# Patient Record
Sex: Female | Born: 1960 | Race: White | Hispanic: No | Smoking: Current every day smoker
Health system: Southern US, Community
[De-identification: ages and names within clinical notes are randomized; demographics above are authoritative.]

## PROBLEM LIST (undated history)

## (undated) HISTORY — PX: BLADDER SUSPENSION: SHX72

## (undated) HISTORY — PX: CHOLECYSTECTOMY: SHX55

## (undated) HISTORY — PX: CARPAL TUNNEL RELEASE: SHX101

## (undated) HISTORY — PX: PORT-A-CATH REMOVAL: SHX5289

## (undated) HISTORY — PX: NISSEN FUNDOPLICATION: SHX2091

## (undated) HISTORY — PX: HIP ARTHROSCOPY W/ LABRAL DEBRIDEMENT: SHX1749

## (undated) HISTORY — PX: ABDOMINAL HYSTERECTOMY: SHX81

## (undated) HISTORY — PX: PORTACATH PLACEMENT: SHX2246

## (undated) HISTORY — PX: BACK SURGERY: SHX140

---

## 1999-12-09 ENCOUNTER — Emergency Department (HOSPITAL_COMMUNITY): Admission: EM | Admit: 1999-12-09 | Discharge: 1999-12-09 | Payer: Self-pay | Admitting: Emergency Medicine

## 1999-12-11 ENCOUNTER — Ambulatory Visit (HOSPITAL_COMMUNITY): Admission: RE | Admit: 1999-12-11 | Discharge: 1999-12-11 | Payer: Self-pay | Admitting: Neurosurgery

## 1999-12-30 ENCOUNTER — Ambulatory Visit (HOSPITAL_COMMUNITY): Admission: RE | Admit: 1999-12-30 | Discharge: 1999-12-30 | Payer: Self-pay | Admitting: Neurosurgery

## 2000-01-13 ENCOUNTER — Ambulatory Visit (HOSPITAL_COMMUNITY): Admission: RE | Admit: 2000-01-13 | Discharge: 2000-01-13 | Payer: Self-pay | Admitting: Neurosurgery

## 2000-01-27 ENCOUNTER — Ambulatory Visit (HOSPITAL_COMMUNITY): Admission: RE | Admit: 2000-01-27 | Discharge: 2000-01-27 | Payer: Self-pay | Admitting: Neurosurgery

## 2000-04-07 ENCOUNTER — Encounter: Admission: RE | Admit: 2000-04-07 | Discharge: 2000-04-07 | Payer: Self-pay | Admitting: Neurosurgery

## 2000-06-10 ENCOUNTER — Ambulatory Visit (HOSPITAL_COMMUNITY): Admission: RE | Admit: 2000-06-10 | Discharge: 2000-06-10 | Payer: Self-pay | Admitting: Neurosurgery

## 2000-07-27 ENCOUNTER — Inpatient Hospital Stay (HOSPITAL_COMMUNITY): Admission: RE | Admit: 2000-07-27 | Discharge: 2000-07-31 | Payer: Self-pay | Admitting: Neurosurgery

## 2000-09-01 ENCOUNTER — Encounter: Admission: RE | Admit: 2000-09-01 | Discharge: 2000-09-01 | Payer: Self-pay | Admitting: Neurosurgery

## 2000-10-30 ENCOUNTER — Encounter: Admission: RE | Admit: 2000-10-30 | Discharge: 2000-10-30 | Payer: Self-pay | Admitting: Neurosurgery

## 2001-01-29 ENCOUNTER — Encounter: Admission: RE | Admit: 2001-01-29 | Discharge: 2001-01-29 | Payer: Self-pay | Admitting: Neurosurgery

## 2001-03-02 ENCOUNTER — Encounter: Payer: Self-pay | Admitting: Orthopedic Surgery

## 2001-03-02 ENCOUNTER — Ambulatory Visit (HOSPITAL_COMMUNITY): Admission: RE | Admit: 2001-03-02 | Discharge: 2001-03-02 | Payer: Self-pay | Admitting: Orthopedic Surgery

## 2001-03-22 ENCOUNTER — Encounter: Payer: Self-pay | Admitting: Orthopedic Surgery

## 2001-03-24 ENCOUNTER — Ambulatory Visit (HOSPITAL_COMMUNITY): Admission: RE | Admit: 2001-03-24 | Discharge: 2001-03-24 | Payer: Self-pay | Admitting: Orthopedic Surgery

## 2001-03-24 ENCOUNTER — Encounter: Payer: Self-pay | Admitting: Orthopedic Surgery

## 2001-06-18 ENCOUNTER — Encounter: Payer: Self-pay | Admitting: Orthopedic Surgery

## 2001-06-18 ENCOUNTER — Ambulatory Visit (HOSPITAL_COMMUNITY): Admission: RE | Admit: 2001-06-18 | Discharge: 2001-06-18 | Payer: Self-pay | Admitting: Orthopedic Surgery

## 2001-07-23 ENCOUNTER — Encounter: Payer: Self-pay | Admitting: Orthopedic Surgery

## 2001-07-23 ENCOUNTER — Ambulatory Visit (HOSPITAL_COMMUNITY): Admission: RE | Admit: 2001-07-23 | Discharge: 2001-07-23 | Payer: Self-pay | Admitting: Orthopedic Surgery

## 2001-12-06 ENCOUNTER — Ambulatory Visit (HOSPITAL_COMMUNITY): Admission: RE | Admit: 2001-12-06 | Discharge: 2001-12-06 | Payer: Self-pay | Admitting: Orthopedic Surgery

## 2001-12-06 ENCOUNTER — Encounter: Payer: Self-pay | Admitting: Orthopedic Surgery

## 2001-12-30 ENCOUNTER — Encounter: Payer: Self-pay | Admitting: Orthopedic Surgery

## 2001-12-30 ENCOUNTER — Ambulatory Visit (HOSPITAL_COMMUNITY): Admission: RE | Admit: 2001-12-30 | Discharge: 2001-12-30 | Payer: Self-pay | Admitting: Orthopedic Surgery

## 2002-07-05 ENCOUNTER — Inpatient Hospital Stay (HOSPITAL_COMMUNITY): Admission: EM | Admit: 2002-07-05 | Discharge: 2002-07-07 | Payer: Self-pay

## 2002-07-07 ENCOUNTER — Encounter: Payer: Self-pay | Admitting: Cardiology

## 2004-10-26 ENCOUNTER — Ambulatory Visit (HOSPITAL_COMMUNITY): Admission: RE | Admit: 2004-10-26 | Discharge: 2004-10-26 | Payer: Self-pay | Admitting: Neurosurgery

## 2005-09-24 ENCOUNTER — Ambulatory Visit (HOSPITAL_COMMUNITY): Admission: RE | Admit: 2005-09-24 | Discharge: 2005-09-24 | Payer: Self-pay | Admitting: Neurosurgery

## 2007-03-21 ENCOUNTER — Encounter: Admission: RE | Admit: 2007-03-21 | Discharge: 2007-03-21 | Payer: Self-pay | Admitting: Neurosurgery

## 2007-04-22 ENCOUNTER — Inpatient Hospital Stay (HOSPITAL_COMMUNITY): Admission: RE | Admit: 2007-04-22 | Discharge: 2007-04-24 | Payer: Self-pay | Admitting: Neurosurgery

## 2007-11-03 ENCOUNTER — Encounter: Admission: RE | Admit: 2007-11-03 | Discharge: 2007-11-03 | Payer: Self-pay | Admitting: Neurosurgery

## 2008-09-04 ENCOUNTER — Ambulatory Visit (HOSPITAL_COMMUNITY): Admission: RE | Admit: 2008-09-04 | Discharge: 2008-09-04 | Payer: Self-pay | Admitting: Neurosurgery

## 2008-09-26 ENCOUNTER — Emergency Department (HOSPITAL_COMMUNITY): Admission: EM | Admit: 2008-09-26 | Discharge: 2008-09-26 | Payer: Self-pay | Admitting: Emergency Medicine

## 2008-09-26 ENCOUNTER — Inpatient Hospital Stay (HOSPITAL_COMMUNITY): Admission: AD | Admit: 2008-09-26 | Discharge: 2008-09-29 | Payer: Self-pay | Admitting: Neurosurgery

## 2008-09-28 ENCOUNTER — Ambulatory Visit: Payer: Self-pay | Admitting: Infectious Diseases

## 2008-10-23 ENCOUNTER — Encounter: Payer: Self-pay | Admitting: Infectious Diseases

## 2008-10-24 ENCOUNTER — Ambulatory Visit: Payer: Self-pay | Admitting: Infectious Diseases

## 2008-10-24 DIAGNOSIS — L0291 Cutaneous abscess, unspecified: Secondary | ICD-10-CM | POA: Insufficient documentation

## 2008-10-24 DIAGNOSIS — L0390 Cellulitis, unspecified: Secondary | ICD-10-CM

## 2008-10-24 LAB — CONVERTED CEMR LAB
Basophils Absolute: 0 10*3/uL (ref 0.0–0.1)
Basophils Relative: 1 % (ref 0–1)
CRP: 0.6 mg/dL — ABNORMAL HIGH (ref <0.6)
Chloride: 105 meq/L (ref 96–112)
Eosinophils Relative: 2 % (ref 0–5)
GFR calc Af Amer: >60 mL/min (ref >60)
GFR calc non Af Amer: >60 mL/min (ref >60)
Lymphs Abs: 3.4 10*3/uL (ref 0.7–4.0)
MCV: 82.5 fL (ref 78.0–100.0)
Neutro Abs: 4.6 10*3/uL (ref 1.7–7.7)
Neutrophils Relative %: 53 % (ref 43–77)
Sed Rate: 5 mm/hr (ref 0–22)
Sodium: 138 meq/L (ref 135–145)

## 2008-10-30 ENCOUNTER — Encounter: Payer: Self-pay | Admitting: Infectious Diseases

## 2008-11-21 ENCOUNTER — Encounter: Payer: Self-pay | Admitting: Infectious Diseases

## 2009-11-27 ENCOUNTER — Encounter: Payer: Self-pay | Admitting: Infectious Diseases

## 2009-12-07 ENCOUNTER — Emergency Department: Payer: Self-pay | Admitting: Emergency Medicine

## 2010-07-16 NOTE — Assessment & Plan Note (Signed)
Summary: [facility] wound inf need chart   Visit Type:  Follow-up Primary Provider:  Clydie Braun MD  CC:  hospital follow-up.  History of Present Illness: 50 yo seen in follow up of a post op wound infection with MSSA. On march 22nd-  carpal tunnel release. Did wellfor 2 wks post op  then one week after removing staples the area got infected - draining pus and swollen with fevers chills and spreading cellulitis. Had drainage by Dr Lovell Sheehan on April 13th with pus encoutered Has been on ancef via picc and it was stopped Friday May 7th. THe area is healing well and no longer swollen. Still tender but not severe. Saw Dr Lovell Sheehan this am and thought it looked good.      Preventive Screening-Counseling & Management     Alcohol drinks/day: 0     Smoking Status: quit     Year Quit: 2009     Caffeine use/day: 2-4 cups of coffee per day     Does Patient Exercise: yes     Type of exercise: walking     Exercise (avg: min/session): 30-60     Times/week: 3     Seat Belt Use: yes   Updated Prior Medication List: Ancef Current Allergies (reviewed today): No known allergies  Past History:  Past Medical History:    MSSA wound infection    [place]    [place]    lumbar stenosis s/p decompression  Family History:    [place]  Social History:    has one child.   Review of Systems       11 systems reviewed and negative except per HPI   Vital Signs:  Patient profile:   50 year old female Height:      66 inches (167.64 cm) Weight:      223.6 pounds (101.64 kg) BMI:     36.22 Temp:     97.5 degrees F (36.39 degrees C) oral Pulse rate:   69 / minute BP sitting:   107 / 72  (right arm)  Vitals Entered By: Baxter Hire) (Oct 24, 2008 10:04 AM) CC: hospital follow-up Is Patient Diabetic? Yes  Nutritional Status BMI of > 30 = obese Nutritional Status Detail blood sugar was not taken this morning. Diabetes is controlled through diet.  Have you ever been in a relationship where  you felt threatened, hurt or afraid?No   Does patient need assistance? Functional Status Self care Ambulation Normal   Physical Exam  General:  alert and well-developed.   Head:  normocephalic.   Mouth:  good dentition.   Neck:  supple.   Lungs:  normal respiratory effort and normal breath sounds.   Heart:  normal rate, regular rhythm, and no murmur.   Msk:  R wrist with some swelling over anterior wrist  but min redness and no drainage Neurologic:  alert & oriented X3 and cranial nerves II-XII intact.   Skin:  desquamation over R palm. no spreading redness or draiange Psych:  Oriented X3 and memory intact for recent and remote.   Additional Exam:  Wrist September 26, 2008 ORGANISM:                     FEW                                STAPHYLOCOCCUS AUREUS  METHOD:  MIC  CLINDAMYCIN:                  RESISTANT  ERYTHROMYCIN:                 >=8                                RESISTANT  GENTAMICIN:                   <=0.5                                SENSITIVE  LEVOFLOXACIN:                 0.25                                SENSITIVE  OXACILLIN:                    2                                SENSITIVE  PENICILLIN:                   >=0.5                                RESISTANT  RIFAMPIN:                     <=0.5                                SENSITIVE  TRIMETH/SULFA:                <=10                                SENSITIVE  VANCOMYCIN:                   1                                SENSITIVE  TETRACYCLINE:                 <=1                                SENSITIVE  MOXIFLOXACIN:                 <=0.25                                SENSITIVE  Todays BW Sodium                    138 mEq/L                   135-145   Potassium  4.3 mEq/L                   3.5-5.3   Chloride                  105 mEq/L                   96-112   CO2                       23 mEq/L                    19-32   Glucose              [H]   138 mg/dL                   98-11   BUN                       14 mg/dL                    9-14   Creatinine                0.76 mg/dL                  0.40-1.20   Calcium                   9.3 mg/dL                   7.8-29.5    WBC                       8.6 K/uL                    4.0-10.5   RBC                       4.45 MIL/uL                 3.87-5.11   Hemoglobin                12.3 g/dL                   62.1-30.8 pt 251 esr 5 crp 0.6      Impression & Recommendations:  Problem # 1:  ABSCESS (ICD-682.9) Post op MSSA infection of R carpal tunnel now s/p 4wks of IV ancef. repeat CBC and esr crp are great today.  WIll pull picc.   Better part of valor would be another 2 weeks of keflex since pt is afraid to have this recur.  I will see as needed.  Her updated medication list for this problem includes:    Keflex 500 Mg Caps (Cephalexin) ..... One by mouth two times a day for 2 weeks  Orders: T-Basic Metabolic Panel [phone]) T-CBC w/Diff [id number]) T-C-Reactive Protein [id number]) T-Sed Rate (Automated) [id number]) Est. Patient Level III (63875)  Medications Added to Medication List This Visit: 1)  Diflucan 150 Mg Tabs (Fluconazole) .... Take as directed for yeast infection 2)  Keflex 500 Mg Caps (Cephalexin) .... One by mouth two times a day for 2 weeks  Patient Instructions: 1)  Follow up as needed. 2)  Stop the ancef and start the keflex for another 2 weeks.  Call if new or concerning symptoms Prescriptions:  KEFLEX 500 MG CAPS (CEPHALEXIN) one by mouth two times a day for 2 weeks  #28 x 0   Entered and Authorized by:   Clydie Braun MD   Signed by:   Clydie Braun MD on 10/24/2008   Method used:   Print then Give to Patient   RxID:   [id number] DIFLUCAN 150 MG TABS (FLUCONAZOLE) take as directed for yeast infection  #2 x 1   Entered and Authorized by:   Clydie Braun MD   Signed by:   Clydie Braun MD on 10/24/2008   Method used:    Print then Give to Patient   RxID:   [id number]

## 2010-07-16 NOTE — Miscellaneous (Signed)
Summary: [facility]: Pt. Care Update  [facility]: Pt. Care Update   Imported By: Florinda Marker 11/02/2008 15:41:15  _____________________________________________________________________  External Attachment:    Type:   Image     Comment:   External Document

## 2010-07-16 NOTE — Consult Note (Signed)
Summary: [facility]  [facility]   Imported By: Florinda Marker 11/22/2008 14:06:09  _____________________________________________________________________  External Attachment:    Type:   Image     Comment:   External Document

## 2010-07-16 NOTE — Consult Note (Signed)
Summary: [facility]  [facility]   Imported By: Florinda Marker 01/24/2010 11:00:59  _____________________________________________________________________  External Attachment:    Type:   Image     Comment:   External Document

## 2010-09-25 LAB — BASIC METABOLIC PANEL
Calcium: 8.7 mg/dL (ref 8.4–10.5)
Chloride: 100 mEq/L (ref 96–112)
Creatinine, Ser: 0.7 mg/dL (ref 0.4–1.2)
GFR calc Af Amer: >60 mL/min (ref >60)
GFR calc non Af Amer: >60 mL/min (ref >60)

## 2010-09-25 LAB — GLUCOSE, CAPILLARY
Glucose-Capillary: 144 mg/dL — ABNORMAL HIGH (ref 70–99)
Glucose-Capillary: 197 mg/dL — ABNORMAL HIGH (ref 70–99)
Glucose-Capillary: 151 mg/dL — ABNORMAL HIGH (ref 70–99)
Glucose-Capillary: 161 mg/dL — ABNORMAL HIGH (ref 70–99)
Glucose-Capillary: 173 mg/dL — ABNORMAL HIGH (ref 70–99)
Glucose-Capillary: 193 mg/dL — ABNORMAL HIGH (ref 70–99)
Glucose-Capillary: 201 mg/dL — ABNORMAL HIGH (ref 70–99)

## 2010-09-25 LAB — HEMOGLOBIN A1C: Hgb A1c MFr Bld: 7.3 % — ABNORMAL HIGH (ref 4.6–6.1)

## 2010-09-25 LAB — CULTURE, BLOOD (ROUTINE X 2): Culture: NO GROWTH

## 2010-09-25 LAB — ANAEROBIC CULTURE

## 2010-09-25 LAB — CBC
HCT: 39.8 % (ref 36.0–46.0)
RDW: 12.6 % (ref 11.5–15.5)

## 2010-09-25 LAB — WOUND CULTURE

## 2010-09-25 LAB — DIFFERENTIAL
Basophils Absolute: 0 10*3/uL (ref 0.0–0.1)
Basophils Relative: 0 % (ref 0–1)
Eosinophils Absolute: 0.2 10*3/uL (ref 0.0–0.7)
Eosinophils Relative: 1 % (ref 0–5)
Lymphocytes Relative: 17 % (ref 12–46)
Lymphs Abs: 2.8 10*3/uL (ref 0.7–4.0)
Neutro Abs: 12.6 10*3/uL — ABNORMAL HIGH (ref 1.7–7.7)

## 2010-09-25 LAB — SEDIMENTATION RATE: Sed Rate: 27 mm/hr — ABNORMAL HIGH (ref 0–22)

## 2010-09-26 LAB — CBC
MCV: 83.1 fL (ref 78.0–100.0)
RBC: 4.2 MIL/uL (ref 3.87–5.11)
WBC: 8.1 10*3/uL (ref 4.0–10.5)

## 2010-09-26 LAB — BASIC METABOLIC PANEL
Calcium: 8.9 mg/dL (ref 8.4–10.5)
Chloride: 107 mEq/L (ref 96–112)
Creatinine, Ser: 0.76 mg/dL (ref 0.4–1.2)
GFR calc Af Amer: >60 mL/min (ref >60)
GFR calc non Af Amer: >60 mL/min (ref >60)

## 2010-09-26 LAB — GLUCOSE, CAPILLARY
Glucose-Capillary: 101 mg/dL — ABNORMAL HIGH (ref 70–99)
Glucose-Capillary: 104 mg/dL — ABNORMAL HIGH (ref 70–99)

## 2010-10-29 ENCOUNTER — Emergency Department: Payer: Self-pay | Admitting: Emergency Medicine

## 2010-10-29 NOTE — Op Note (Signed)
[name], [name]                 ACCOUNT NO.:  [id number]   MEDICAL RECORD NO.:  [id number]          PATIENT TYPE:  AMB   LOCATION:  SDS                          FACILITY:  [facility]   PHYSICIAN:  Cristi Loron, M.D.DATE OF BIRTH:  04-09-1961   DATE OF PROCEDURE:  09/04/2008  DATE OF DISCHARGE:                               OPERATIVE REPORT   BRIEF HISTORY:  The patient is a 50 year old white female who has  suffered from bilateral hand numbness consistent with carpal tunnel  syndrome.  This was confirmed with anesthesia team and the patient  failed medical management, and therefore, I discussed surgery with her.  The patient has weighed the risks, benefits, and alternatives of surgery  and decided to proceed with a right carpal tunnel release.   PREOPERATIVE DIAGNOSIS:  Bilateral carpal tunnel syndrome.   POSTOPERATIVE DIAGNOSIS:  Bilateral carpal tunnel syndrome.   PROCEDURE:  Right median neurolysis at the wrist (carpal tunnel  release).   SURGEON:  Cristi Loron, MD   ASSISTANT:  None.   ANESTHESIA:  MAC.   ESTIMATED BLOOD LOSS:  Minimal.   SPECIMENS:  None.   DRAINS:  None.   COMPLICATIONS:  None.   DESCRIPTION OF PROCEDURE:  The patient was brought to the operating room  by the anesthesia team.  Her right hand was then prepared with Betadine  scrub and Betadine solution.  Sterile drapes were applied.  I then  injected the area to be incised with Marcaine solution.  I then used a  scalpel to make an incision in the patient's right palmar crease.  I  used the Horticulturist, commercial for exposure.  We then identified the  superficial fascia and divided with a #15 blade scalpel.  We then  identified the transverse carpal ligament.  I incised with a #15 blade  scalpel.  We identified the median nerve.  Working on the ulnar nerve  aspect of the median nerve, I used Metzenbaum scissors to divide the  transverse carpal ligament, both proximally and distally.  We got  a good  decompression of the median nerve.  We then obtained hemostasis using  bipolar electrocautery.  We irrigated the wound out with bacitracin  solution.  I then removed the retractors and then reapproximated the  patient's subcutaneous tissue with interrupted 3-0 Vicryl suture and the  skin with a running 3-0 nylon suture.  The wound was then coated with  bacitracin ointment  and sterile dressing was applied.  The drapes were removed, and the  patient was subsequently transported to the postanesthesia care unit in  stable condition.  All sponge, instrument, and needle counts were  correct at the end of this case.      Cristi Loron, M.D.  Electronically Signed     JDJ/MEDQ  D:  09/04/2008  T:  09/05/2008  Job:  [id number]

## 2010-10-29 NOTE — Discharge Summary (Signed)
[name], [name]                 ACCOUNT NO.:  [id number]   MEDICAL RECORD NO.:  [id number]          PATIENT TYPE:  INP   LOCATION:  3034                         FACILITY:  [facility]   PHYSICIAN:  Stefani Dama, M.D.  DATE OF BIRTH:  03-01-1961   DATE OF ADMISSION:  04/22/2007  DATE OF DISCHARGE:  04/24/2007                               DISCHARGE SUMMARY   ADMITTING DIAGNOSES:  Lumbar spondylosis L4-L5 with neurogenic  claudication and lumbar radiculopathy.   DISCHARGE AND FINAL DIAGNOSES:  Lumbar spondylosis L4-5, L5-S1 with  spondylosis, stenosis and radiculopathy, neurogenic claudication.   ADDITIONAL DISCHARGE DIAGNOSES:  Includes diabetes and hypertension.   CONDITION ON DISCHARGE:  Stable.   HOSPITAL COURSE:  [name] is a 50 year old individual who has had  significant back and bilateral lower extremity pain.  After evaluation  as an outpatient, she was advised regarding surgical decompression  arthrodesis from L4 to the sacrum.  This was performed by Dr. Lovell Sheehan on  the 6th.  Postoperatively the patient has been gradually mobilized and  switched from IV parenteral medication to oral pain medication in the  form of Percocet and Valium.  She is tolerating this well, and incision  is clean and dry at the time of discharge.  She is on being discharged  home on Percocet 10 and Valium 5s  She will be seen in the office in  three weeks' time.   CONDITION ON DISCHARGE:  Improving.   ADDITIONAL DISCHARGE DIAGNOSIS:  Includes hypokalemia.      Stefani Dama, M.D.  Electronically Signed     HJE/MEDQ  D:  04/24/2007  T:  04/25/2007  Job:  [id number]

## 2010-10-29 NOTE — Op Note (Signed)
[name], FOOS                 ACCOUNT NO.:  [id number]   MEDICAL RECORD NO.:  [id number]          PATIENT TYPE:  INP   LOCATION:  3041                         FACILITY:  [facility]   PHYSICIAN:  Cristi Loron, M.D.DATE OF BIRTH:  1960/08/05   DATE OF PROCEDURE:  09/26/2008  DATE OF DISCHARGE:                               OPERATIVE REPORT   BRIEF HISTORY:  The patient is a 50 year old white female who I  performed a right carpal tunnel release on about 3 weeks ago.  I saw her  a week ago  today for wound check, her incision looked good and I  removed the sutures.  The patient did well until yesterday when she  began having a lot of swelling and I saw her in the office today and I  admitted her for wound infection.  I discussed the various treatment  options with her and recommend that we proceed with an incision and  drainage of her wound.  I described the procedure and the risk and  answered all questions.  She decided to proceed with operation.   PREOPERATIVE DIAGNOSIS:  Right carpal tunnel release for wound  infection.   POSTOPERATIVE DIAGNOSIS:  Right carpal tunnel release for wound  infection.   PROCEDURE:  Incision and drainage of right carpal tunnel release wound.   SURGEON:  Cristi Loron, MD   ASSISTANT:  None.   ANESTHESIA:  General endotracheal.   ESTIMATED BLOOD LOSS:  Minimal.   SPECIMENS:  None.   CULTURES:  None.   DRAINS:  None.   COMPLICATIONS:  None.   DESCRIPTION OF PROCEDURE:  The patient was brought to the operating room  by the Anesthesia Team and general endotracheal anesthesia was induced.  The patient remained in a supine position.  Her right upper extremity  was then prepared with Betadine scrub and Betadine solution.  Sterile  drapes were applied.  I then used a 15 blade scalpel to incise the  patient's fresh surgical scar.  Upon doing this, we immediately  encountered some pus.  There was not whole lot of plus there may be  several mLs.  We cultured this and sent off to the lab for stat Gram  stain.  We having placed the retractors and dissected deeper identified  the transverse carpal ligament.  The tissues were quite swollen and  inflamed.  We encountered little bit more purulent material size pus  proximally and distally along the transection of the transverse carpal  ligament and removed this using suction irrigation and identified the  median nerve.  We then irrigated the wound out copiously with bacitracin  solution.  I did not encounter nor could express any more pus.  We then  obtained hemostasis using bipolar cautery.  Because of the patient's  fresh infection, I decided not to close the wound impacted with  nonadherent strip gauze.  Dressing was applied to the patient's wound  consisting of Kerlix and Ace wrap.  The patient was subsequently  extubated by the Anesthesia Team and transported to the postanesthesia  care unit in stable condition.  All sponge, instrument, and needle  counts were correct at the end of this case.      Cristi Loron, M.D.  Electronically Signed     JDJ/MEDQ  D:  09/26/2008  T:  09/27/2008  Job:  [id number]

## 2010-11-01 NOTE — Op Note (Signed)
[place]  Patient:    [name], [name]                        MRN: [id number] Proc. Date: 07/27/00 Adm. Date:  [id number] Disc. Date: [id number] Attending:  Tressie Stalker D                           Operative Report  PREOPERATIVE DIAGNOSIS:  L5-S1 herniated nucleus pulposus, degenerative disk disease, lumbago, lumbar radiculopathy.  POSTOPERATIVE DIAGNOSIS:  L5-S1 herniated nucleus pulposus, degenerative disk disease, lumbago, lumbar radiculopathy.  OPERATION PERFORMED:  Left L5 laminotomy, foraminotomy, diskectomy L5-S1, posterior lumbar interbody fusion L5-S1 with insertion of Synthes TLIF cortical bone dowel, posterior ____________ instrumentation L5-S1 with [employer] 90 degree titanium pedicle screws and rods, posterolateral arthrodesis L5-S1 with local morselized autograft bone, cancellous allobgraft bone and Orthoblast putty.  SURGEON:  Cristi Loron, M.D.  ASSISTANT:  Danae Orleans. Venetia Maxon, M.D.  ANESTHESIA:  General endotracheal.  ESTIMATED BLOOD LOSS:  350 cc.  SPECIMENS:  None.  DRAINS:  None.  COMPLICATIONS:  None.  INDICATIONS FOR PROCEDURE:  The patient is a 50 year old white female who suffered from greater than 18 months of intractable back and left leg pain. She failed medical management and was worked up with a lumbar MRI as well as a lumbar diskogram which demonstrated significant degenerative disk disease at L5-S1.  As she has failed extensive nonsurgical management, I discussed surgical treatment options with her including a lumbar decompression and fusion.  The patient has weighed the risks, benefits and alternatives of surgery and decided to proceed with the operation.  DESCRIPTION OF PROCEDURE:  The patient was brought to the operating room by the anesthesia team. General endotracheal anesthesia was induced.  The patient then was turned to the prone position on the chest rolls.  Her lumbosacral region  was then prepared with Betadine scrub and Betadine solution.  Sterile drapes were applied.  I then injected the area to be incised with Marcaine with epinephrine solution and used a scalpel to make a vertical incision in the midline over the lumbosacral junction.  I used electrocautery to dissect down through the adipose tissue down to the thoracolumbar fascia.  I divided the fascia bilaterally performing a subperiosteal dissection, stripping the paraspinous musculature from the spinous process and lamina of L4-5 and the upper sacrum.  I inserted the McCullough retractor for exposure.  I obtained the intraoperative radiograph to confirm my location.  I then used the high speed drill to perform a left L5 laminotomy.  I widened the laminotomy with the Kerrison punch removing the medial aspect of the left L5-S1 facet joint as well as the L5-S1 ligamentum flavum decompressing the thecal sac.  I performed a foraminotomy about the S1 nerve root and then decompressed and identified the left L5 nerve root as it exited around the L5 pedicle.  I then freed up the thecal sac and the S1 nerve root from the epidural veins which were fairly large, coagulated these veins and divided them and then incised and then carefully retracted the thecal sac and the S1 nerve root medially with the DErrico retractor exposing the L5-S1 intervertebral disk.  I incised the intervertebral disk with the 15 blade scalpel and performed aggressive diskectomy using the pituitary forceps, Epstein and Scoville curets as well as the Box curets.  I cleaned the soft tissue from the vertebral end plates at L5  and S1.  I then used the interlaminar spreader to distract the L5 and S1 vertebral bodies to open up the disk space.  I then carefully retracted the thecal sac and S1 nerve root medially with the DErrico retractor and then inserted a 7 mm TLIF trial spacer.  It was too loose.  I put the 9 mm one in and it fit nicely with a good  snug fit.  I then obtained a 9 mm TLIF allograft bone, reconstituted it and then inserted into the left L5-S1 interspace after carefully retracting the thecal sac and S1 nerve root medially and protecting the L5 nerve root with another DErrico retractor.  I then under fluoroscopic guidance, tapped it into place.  I augmented this posterior lumbar interbody fusion by placing a combination of cancellous allograft bone and local morselized autograft bone into the interspace posterior to the Synthes TLIF bone dowel.  Having completed the posterior lumbar interbody fusion, I now turned my attention to the instrumentation.  I used electrocautery to expose the bilateral L5 transverse process as well as the sacral ala.  I then under fluoroscopic guidance, used a drill to decorticate posterior to the bilateral L5 and S1 pedicles.  I cannulated the pedicle with the pedicle probe.  I then tapped the pedicles and removed the tap and then probed about the tapped area to make sure it was all within the bone.  It was and then I inserted 6.75 x 45 mm pedicle screws bilaterally at L5 and 6.75 x 35 mm pedicle screws bilaterally at S1.  All done under fluoroscopic guidance.  I then palpated about the pedicles and noted that the screws were felt to be well within the bone.  I used the very long nerve hooks to reach across the midline and palpate about the left L5-S1 pedicle and it felt good there,too.  I then obtained 50 mm titanium rods, bent them into the appropriate lordosis and then secured them to the screws using the appropriate caps.  I did not compress this construct as I distracted a good bit while placing the TLIF bone graft in.  Having completed the posterior nonsegmental instrumentation, I now turned my attention to the posterolateral arthrodesis.  I used a high speed drill to clear soft tissue from the remainder of the left pars and L5-S1 facet region as well as the transverse process at L5  and the sacral ala.  I also did so on the right side.  I cleaned the soft tissue and then I laid a combination of local morselized autograft bone, cancellous allograft bone and Orthoblast  putty to complete the posterolateral arthrodesis.  I then palpated about the thecal sac and the left L5 and S1 nerve root and noted they were well decompressed and then I removed the McCullough retractor and reapproximated the patients thoracolumbar fascia with interrupted #1 Vicryl in the subcutaneous tissues and interrupted 2-0 Vicryl suture in the skin with Steri-Strips and benzoin.  The wound was then covered with bacitracin ointmetn and sterile dressings were applied.  The drapes were removed.  The patient was subsequently returned to the supine position where she was extubated by the anesthesia team and transported to the post anesthesia care unit in stable condition.  All sponge, needle and instrument counts were correct at the end of this case. DD:  07/27/00 TD:  07/27/00 Job: [id number]

## 2010-11-01 NOTE — Consult Note (Signed)
NAME:  [name], [name]                           ACCOUNT NO.:  [id number]   MEDICAL RECORD NO.:  [id number]                   PATIENT TYPE:  INP   LOCATION:  3705                                 FACILITY:  [facility]   PHYSICIAN:  Jesse Sans. Wall, M.D. [facility]            DATE OF BIRTH:  Feb 02, 1961   DATE OF CONSULTATION:  07/06/2002  DATE OF DISCHARGE:                                   CONSULTATION   CHIEF COMPLAINT:  Chest tightness like an anvil on my chest and increased  shortness of breath recently developing.   HISTORY OF PRESENT ILLNESS:  The patient is a very pleasant 50 year old  without known coronary artery disease, who has had increased fatigue in the  last two weeks.  While watching the [employer] doing a double overtime win 1-  1/2 weeks ago, she developed chest tightness consistent with the above  complaint.  It did not radiated.  She denies any nausea, vomiting, or  diaphoresis at the time.  She has also noted dyspnea on exertion,  palpitations, and feeling dizzy lately. She has had no syncope.   She was admitted through the ER yesterday.  EKG basically is normal.  First  set of enzymes is negative.   ALLERGIES:  No known drug allergies.   MEDICATIONS:  Prevacid 30 mg a day for reflux.   PAST MEDICAL HISTORY:  She has a history of depression and she has had a  hysterectomy in the past.  She is also a diet-controlled diabetic and has  been known for this for about a year.  She lives in [place] with her fiance.  She has one child.  She quit smoking three weeks ago, but had smoked for a  number of years.  She has done Weight Watchers for two weeks.  She does no  drugs.   FAMILY HISTORY:  Noncontributory except her father is alive at age 101 with  coronary artery disease.   REASON FOR ADMISSION:  Negative.   PHYSICAL EXAMINATION:  VITAL SIGNS:  Blood pressure 110/80, pulse 82 and  regular, temperature 97.5, respiratory rate 20, O2 saturation 96% on room  air.  GENERAL:  She  is in no acute distress.  HEENT:  Totally unremarkable.  NECK:  No JVD. Carotid upstrokes are equal bilaterally without bruits.  No  thyromegaly or lymphadenopathy.  HEART:  Basically normal S1 and S2 without murmurs, rubs, or gallops.  LUNGS:  Clear.  SKIN: No rashes or lesions.  ABDOMEN:  Soft, obese.  There is no obvious organomegaly.  EXTREMITIES: No cyanosis, clubbing, or edema.  She has trace bilateral lower  extremity edema. There is no tenderness in the calf and no sign of phlebitis  or DVT.   Chest x-ray is pending.  EKG shows sinus tachycardia, grade 1/8.  Repeat EKG  is essentially normal.   CPK and troponin are negative x2.  TSH and lipid panel are  pending.  Glucose  is running between 164 and 243.    ASSESSMENT:  1. Chest pain somewhat worrisome, but not clear cut and specific of angina     or ischemic heart disease.  2. Dyspnea on exertion.  3. Diabetes type 2.  4. Obesity.  5. Depression.  6. Gastroesophageal reflux disease.   RECOMMENDATIONS:  1. Check two-dimensional echocardiogram.  If this is negative and her third     set of enzymes are negative, we have arranged for her to have an     outpatient Cardiolite in our office on [facility] at 12:30 on July 08, 2002.  2. I have discussed also with her cardiac risk factor modification.  I would     treat her lipids quite aggressively if there is a problem with her     parameters.                                               Thomas C. Daleen Squibb, M.D. [facility]    TCW/MEDQ  D:  07/06/2002  T:  07/06/2002  Job:  [id number]   cc:   Dr. Hulen Luster

## 2010-11-01 NOTE — H&P (Signed)
[place]. [facility]  Patient:    [name], [name]                        MRN: [id number] Adm. Date:  [id number] Disc. Date: [id number] Attending:  Tressie Stalker D                         History and Physical  CHIEF COMPLAINT:  Back pain, left leg pain.  HISTORY OF PRESENT ILLNESS:  The patient is a 50 year old white female who has been having trouble with her back for approximately 18 months.  She has been treated extensively with medications, therapy, steroid injections, rest, etc., and failed to improve.  She was subsequently referred to me and worked up further with a lumbar MRI as well as a lumbar diskogram after she failed extensive medical management.  The diskogram demonstrated concordant pain at L5-S1 with significant degenerative disk disease and subligamentous disk herniation.  I therefore discussed this with her including doing nothing, continued medical management, and surgery, ie, lumbar fusion.  The patient weighed the risks, benefits, and alternatives of surgery and decided to proceed with a lumbar decompression and fusion.  PAST MEDICAL HISTORY:  Positive for gastroesophageal reflux disease, remote history of cholecystitis, depression.  PAST SURGICAL HISTORY:  Hysterectomy in 1983 by Dr. Sundra Aland and reflux surgery - cholecystectomy by Dr. Westley Chandler in 1986.  MEDICATIONS: 1. Celex 20 mg p.o. q.d. 2. Prevacid 30 mg p.o. q.d. 3. Hydrocodone p.r.n.  ALLERGIES:  No known drug allergies.  FAMILY HISTORY:  The patients mother is age 24, she has diabetes mellitus, hypertension, and gout.  She does not know her father.  SOCIAL HISTORY:  The patient is single.  She is engaged to be married.  She has a 48 year old child.  [place]  She is employed as an Journalist, newspaper.  She denies drug and alcohol use.  She smokes one pack a day of cigarettes and I highly advised her to quit.  REVIEW OF SYSTEMS:  Negative  except as above.  PHYSICAL EXAMINATION:  GENERAL:  A pleasant, obese, 50 year old white female who walks with an antalgic gait.  VITAL SIGNS:  Height 5 foot 6 inches, weight 218 pounds.  HEENT:  Normocephalic and atraumatic.  Pupils are equal, round and reactive to light.  Extraocular muscles intact.  Sclerae white.  Conjunctivae pink. Oropharynx benign.  Uvula midline.  NECK:  Normal.  CHEST:  Thorax symmetric.  Lungs clear to auscultation.  HEART:  Regular rate and rhythm.  ABDOMEN:  Soft and nontender, obese.  EXTREMITIES:  Obese.  No deformities.  BACK:  There is no point tenderness or deformities.  She has mild tenderness to palpation diffusely at the lumbosacral junction.  She has a limited range of motion to flexion, extension, and lateral bending.  Straight leg raise testing is positive on the left causing predominantly back pain, but some leg pain, negative on the right.  Fabres test is positive on the left, negative on the right.  NEUROLOGICAL:  The patient is alert and oriented x 3.  Cranial nerves II-XII intact bilaterally.  Vision and hearing grossly normal bilaterally.  Motor strength is 5/5 bilateral deltoid, bicep, tricep, handgrip, wrist extensor, interosseous psoas, quadriceps, gastrocnemius, extensor hallucis longus, although, she does give way occasionally to bilateral lower extremities. Sensory examination is normal to light touch and pinprick sensation to all tested dermatomes bilaterally.  Deep tendon  reflexes are 2/4 in bilateral biceps, triceps, brachial radialis, quadriceps, and gastrocnemius.  Has bilateral flexor and plantar reflexes.  No ankle clonus.  IMAGING STUDIES:  The patient has a diskogram performed on June 10, 2000, at [facility] which demonstrates significant degenerative disease and concordant pain at L5-S1.  The patient also has a lumbar MRI which demonstrates some degenerative disease at L5-S1, otherwise  fairly unremarkable.  ASSESSMENT:  L5-S1 degenerative disk disease, herniated nucleus pulposus, lumbego lumbar radiculopathy.  PLAN:  I had a long discussion with the patient and her fiance and reviewed the diskogram and MRI with them, pointed out the abnormalities.  She has been treated with extensive medical management including time, rest, therapy, medications, injections, etc., all to no avail.  I discussed the current treatment options including doing nothing, continued medical management, surgery, etc.  I have described the procedure of an L5-S1 diskectomy, posterior lumbar interbody fusion with insertion of allograft bone dowels, pedicle screws and rods.  I have shown her surgical models, discussed the risks of surgery extensively.  The patient has weighed the risks, benefits, and alternatives of surgery and wants to proceed with the operation on July 27, 2000. DD:  07/27/00 TD:  07/27/00 Job: [id number]

## 2010-11-01 NOTE — Op Note (Signed)
[place]. [facility]  Patient:    [name], [name] Visit Number: [id number] MRN: [id number]          Service Type: DSU Location: [facility] [id number] Attending Physician:  Georgena Spurling Dictated by:   Georgena Spurling, M.D. Proc. Date: 03/25/01 Admit Date:  03/24/2001 Discharge Date: 03/24/2001                             Operative Report  PREOPERATIVE DIAGNOSIS:  Left labral tear of the hip.  POSTOPERATIVE DIAGNOSIS:  Left labral tear of the hip.  OPERATION PERFORMED:  Left hip arthroscopy with labral debridement.  SURGEON:  Georgena Spurling, M.D.  ANESTHESIA:  General.  INDICATIONS FOR PROCEDURE:  The patient is a 50 year old with MRI documented labral tear and mechanical symptoms.  DESCRIPTION OF PROCEDURE:  The patient was laid supine and administered general endotracheal anesthesia, placed in traction on the [facility] table with the right leg in lithotomy position and the right hip and leg were prepped and draped in the usual sterile fashion.  A C-arm was used in AP and lateral planes to introduce a guide wire into the hip joint to create an anterior portal 5 cm lateral to the femoral artery and approximately 8 cm distal to the ASIS.  We then placed a nitinol wire followed that with a 5 mm and 7 mm dilator in the cannula for the scope.  We then created a lateral portal in likewise fashion with a guide wire, nitinol wire and dilators and placed the shaver.  We immediately located the anterior superolateral labral tear.  It was a large tear approximately 3 cm in length.  We debrided that.  We then switched our camera to lateral and our shaver to anterior and debrided it further.  The labral tear did seem to continue down somewhat medially and posteriorly beyond the limitations of where we could reach with the scope beyond the curvature of the femoral head.  I think that I probably debrided at least 75% of the larger tear.  I then lavaged the joint and closed  the portals with interrupted 4-0 nylon sutures.  The patient tolerated the procedure well.   COMPLICATIONS:  None.  DRAINS:  None. Dictated by:   Georgena Spurling, M.D. Attending Physician:  Georgena Spurling DD:  03/24/01 TD:  03/25/01 Job: [id number]

## 2010-11-01 NOTE — Discharge Summary (Signed)
NAMECYRSTAL, [name]                 ACCOUNT NO.:  [id number]   MEDICAL RECORD NO.:  [id number]          PATIENT TYPE:  INP   LOCATION:  3041                         FACILITY:  [facility]   PHYSICIAN:  Cristi Loron, M.D.DATE OF BIRTH:  12-21-1960   DATE OF ADMISSION:  09/26/2008  DATE OF DISCHARGE:  09/29/2008                               DISCHARGE SUMMARY   BRIEF HISTORY:  The patient is a 50 year old white female whom I  performed a right carpal tunnel release about 3 weeks ago.  She  developed wound infection and was admitted for incision and drainage and  IV antibiotics.   For further details of this admission, please refer to the history and  physical.   HOSPITAL COURSE:  Admitted the patient to [facility] on October 13, 2008, with diagnosis of infected carpal tunnel incision.  On the day  of admission, I performed incision and drainage of the wound, obtained  cultures.  We asked Infectious Disease to see the patient.  She was seen  by Dr. Johny Sax.  He arranged for her to get home IV antibiotics,  and PICC line was placed.  Cultures grew methicillin-sensitive Staph  aureus.  She was discharged home on September 29, 2008, with arrangements  made for home IV Ancef 1 g IV q.8 h. and instructed to follow up with me  in 1 week.   DISCHARGE INSTRUCTIONS:  The patient was given written discharge  instructions, instructed to follow up with me in 1 week, and to follow  up with Dr. Ninetta Lights.   FINAL DIAGNOSIS:  Carpal tunnel incision wound infection.   PROCEDURE PERFORMED:  Incision and drainage of carpal tunnel wound  infection and placement of PICC line.      Cristi Loron, M.D.  Electronically Signed     JDJ/MEDQ  D:  11/01/2008  T:  11/01/2008  Job:  [id number]   cc:   Lacretia Leigh. Ninetta Lights, M.D.

## 2010-11-01 NOTE — Discharge Summary (Signed)
[place]. [facility]  Patient:    [name], [name]                        MRN: [id number] Adm. Date:  [id number] Disc. Date: [id number] Attending:  Tressie Stalker D                           Discharge Summary  For full details of this admission please refer to typed History and Physical.  BRIEF ADMISSION HISTORY:  The patient is a 50 year old white female who suffer  s from intractable back pain.  She has failed extensive medical management, was worked up with an abdominal CT, lumbar MRI, lumbar discogram, etc. which demonstrated degenerative disease at L5-S1.  She therefore weighed the risks, benefits, and alternatives to surgery and decided to proceed with a lumbar fusion.  For Past Medical History, Past Surgical History, Medications Prior to Admission, Drug Allergies, Family Medical History, Social History, Admission Physical Exam, Imaging Studies, Assessment, Plan, etc., please refer to the typed History and Physical.  HOSPITAL COURSE:  I performed an L5-S1 posterior lumbar interbody fusion with insertion of Synthes cortical bone dowels, and Surgical Dynamic pedicle screws and rods L5-S1 without complications on July 27, 2000 (for full details of this operation, please refer to typed Operative Note).  Postoperative course:  The patients postoperative course was essentially unremarkable.  She did have a couple of low grade fevers which was attributed to atelectasis and resolved on its own.  By postoperative day #4, i.e., July 31, 2000, the patient was afebrile, her breath sounds were stable, she was eating well, ambulating well, her wound was healing well without signs of infection, she had normal motor strength, and she was requesting discharge home.  She was therefore discharged home on July 31, 2000.  DISCHARGE INSTRUCTIONS:  The patient was to given written discharge instructions.  DISCHARGE FOLLOWUP:  She was to follow up with me in  four weeks, wear the lumbar corset at all times while out of bed.  DISCHARGE MEDICATIONS: 1. Percocet 5 #60 one to two p.o. q.4h. p.r.n. pain, limit eight per day, no    refill. 2. Valium 5 mg #40 one p.o. q.6h. p.r.n. for muscle spasm, one refill.  FINAL DIAGNOSES:  L5-S1 degenerative disease with herniated nucleus pulposus, lumbago, lumbar radiculopathy.  PROCEDURE PERFORMED:  L5-S1 posterior lumbar interbody fusion, insertion of cortical bone dowels, posterior nonsegmental instrumentation with Surgical Dynamic pedicle screws and rods L5-S1, posterior lateral arthrodesis L5-S1. DD:  07/31/00 TD:  08/01/00 Job: [id number]

## 2010-11-01 NOTE — Discharge Summary (Signed)
NAME:  [name], [name]                           ACCOUNT NO.:  [id number]   MEDICAL RECORD NO.:  [id number]                   PATIENT TYPE:  INP   LOCATION:  3705                                 FACILITY:  [facility]   PHYSICIAN:  Ellender Hose. Earlene Plater, N.P.              DATE OF BIRTH:  April 30, 1961   DATE OF ADMISSION:  07/05/2002  DATE OF DISCHARGE:  07/07/2002                                 DISCHARGE SUMMARY   DISCHARGE DIAGNOSES:  1. Atypical chest pain:  resolved.  2. Non-insulin-dependent diabetes mellitus.  3. Obesity.  4. Tobacco abuse.  5. Depression.  6. Gastroesophageal reflux disease.   DISCHARGE MEDICATIONS:  1. Protonix 40 mg p.o. daily  2. Vicodin 1 or 2 tabs q.4-6h p.r.n. pain.  The patient is not to take any     type of NSAID.   ALLERGIES:  NKDA.   PROCEDURES:  None.   HISTORY OF PRESENT ILLNESS:  The patient is a 50 year old female with diet  controlled diabetes who presented with a 1 day history of fatigue worsening  after warm bath last evening.  The patient states chest pain with no  radiation, worse with movement and inspiration and associated with shortness  of breath starting yesterday, worsening after a hot bath in association with  the fatigue.  The patient however able to sleep through the night but  symptoms returned and she called a physician at an Urgent Care who told her  to go to the ER.  The patient states that she took a water pill in the  morning before presentation, prescribed for her boyfriend, because she felt  bloated.  Otherwise no changes in meds.  Denies orthopnea, PND, acute onset  of symptoms, no nausea, vomiting, diarrhea or fever.  The patient had cold 3-  4 weeks ago.  The rest of ROS is negative.   EKG in the emergency room showed normal sinus rhythm with no acute ST wave  changes for ectopy.  The patient is admitted for evaluation of chest pain  and to rule out MI.   HOSPITAL COURSE:  The patient was admitted to Telemetry Unit on  which she  maintained sinus rhythm without ectopy or acute ST wave changes.  Provided  with IV hydration.  Three sets of cardiac enzymes were performed and found  to be negative.  A 2-D echo was performed which revealed normal left  ventricular systolic function, left ventricular ejection fraction estimated  at 55-65%.  Left ventricular wall thickness was found to be at the upper  limits of normal.  The patient was evaluated by Dr. Filbert Berthold  of Cardiology.  Her vital signs remained stable throughout her stay.  At the time of  presentation, the vital signs included a temp of 97.0, blood pressure  132/77, pulse 128, respirations 20, pulse ox 97%.  At the time of discharge,  vital signs included a temperature of 97.4,  blood pressure 122/75, heart  rate 88, respirations 18, room air sats are 96%.   The patient states she has a history of depression with associated anxiety  disorder.  Discussed with the patient the possibility of starting  antidepressant medication which she declines at this time.  Encouraged  patient to follow up with her primary care physician for same.   In light of negative test results to date as well as Dr. Gennaro Africa evaluation,  it is clear that patient did not suffer an MI during this episode.  She will  undergo outpatient Cardiolite stress testing on 07/08/2002.  It is felt at  this time that patient's symptoms are probably most closely related to her  GERD for which she has taken Prevacid in the past.  The patient was  maintained on Protonix during this admission with improvement in symptoms  and she will be discharged on same.   At time of presentation, patient's serum glucose was 135, and CBGs during  this visit were 166 and 200.  Hemoglobin A1c was checked and found to be  7.2.  The patient states that her physician has told her not to take  medicine for her diabetes yet.  She is strongly encouraged to follow up  with her physician regarding the state of her  diabetes.  A TSH was also  checked on this visit which was 1.8.   The patient is strongly encouraged not to use any type of NSAID, the brand  and generic names of these are included in her discharge instructions, and  to continue her Protonix.   DISCHARGE LABS:  Sodium 136, potassium 3.6, BUN 23, creatinine 0.9, white  blood cell count 9.9, hemoglobin 12.3, hematocrit 37.0, platelet count 251,  total cholesterol 168, triglycerides 193, HDL 39, LDL 90, D-dimer was  normal.   CONSULTS:  Dr. Filbert Berthold, Cardiology.   CONDITION AT DISCHARGE:  Good.   DISPOSITION:  Discharged to home.   FOLLOWUP:  The patient is scheduled for stress testing at [facility]  Cardiology on 07/08/2002 at 12:15 p.m.  She is also strongly encouraged to  follow up with her primary care physician regarding her diabetes.   DICTATED BY:  Gifford Shave, NP                                                   Ellender Hose. Earlene Plater, N.P.    SMD/MEDQ  D:  07/07/2002  T:  07/08/2002  Job:  [id number]   cc:   Enzo Montgomery. Filbert Berthold, M.D.  [address] [place] [address]  Fax: [phone]

## 2010-11-01 NOTE — Op Note (Signed)
NAMEARMELIA, [name]                 ACCOUNT NO.:  [id number]   MEDICAL RECORD NO.:  [id number]          PATIENT TYPE:  INP   LOCATION:  3034                         FACILITY:  [facility]   PHYSICIAN:  Cristi Loron, M.D.DATE OF BIRTH:  1961/01/20   DATE OF PROCEDURE:  DATE OF DISCHARGE:  04/24/2007                               OPERATIVE REPORT   BRIEF HISTORY:  The patient is a 50 year old white female on whom I had  previously performed an L5-S1 decompression and fusion.  She did well  for several years but has developed recurrent back and leg pain.  She  was worked up with a lumbar MRI which demonstrated she had developed  significant adjacent segment degenerative changes with stenosis at L4-  L5.  I discussed the various treatment options with the patient  including surgery.  The patient has weighed the risks, benefits, and  alternatives of surgery and decided to proceed with an L4-L5  decompression and fusion.   PREOPERATIVE DIAGNOSES:  L4-L5 degenerative disease, spinal stenosis,  lumbar radiculopathy, and lumbago.   POSTOPERATIVE DIAGNOSES:  L4-L5 degenerative disease, spinal stenosis,  lumbar radiculopathy, and lumbago.   PROCEDURE:  Bilateral L4 laminotomies and foraminotomies; decompression  of both the L4-L5 nerve roots; L4-L5 transforaminal lumbar interbody  fusion; insertion of L4-L5 interbody prosthesis (Capstone PEEK interbody  prosthesis); L4-S1 posterior segmental instrumentation with  titanium  pedicle screws and rods; L4-L5 posterolateral arthrodesis with local  autograft bone and VITOSS bone-graft extender; exploration of L5-S1  fusion.   SURGEON:  Cristi Loron, M.D.   ASSISTANT:  Hilda Lias, M.D.   ANESTHESIA:  General endotracheal.   ESTIMATED BLOOD LOSS:  250 mL.   SPECIMENS:  None.   DRAINS:  None.   COMPLICATIONS:  None.   DESCRIPTION OF PROCEDURE:  The patient was brought to the operating room  by the anesthesia team.  General  endotracheal anesthesia was induced.  The patient was then turned to the prone position on the Wilson frame.  Lumbosacral region was then prepared with Betadine scrub and Betadine  solution.  Sterile drapes were applied.  I then injected the area to be  incised with Marcaine with epinephrine solution.  I used a scalpel to  make a linear midline incision through the patient's previous surgical  scar.  I used electrocautery to perform a bilateral subperiosteal  dissection exposing the spinous process lamina of L3-L4, L4-L5, and  upper sacrum.  We began exploration of the arthrodesis by removing the  caps from the old ST-90 screws and then removed the rods.  I then  attempted to move the unilateral pedicle screws independently, but I  could not get it moved.  It seemed like she had a good arthrodesis at L5-  S1.   We now turned attention to the lumbar decompression.  I used a high-  speed drill to perform bilateral L4 laminotomies.  I widened the  laminotomies with a Kerrison punch and then removed the excess  ligamentum flavum and the medial aspect of facet joints bilaterally.  I  performed foraminotomy about the bilateral L4 and  L5 nerve roots  completing the decompression.  Of note, this decompression was in  excessive of what was required to do the interbody fusion in order to  decompress the nerves secondary to the severe stenosis.   Having completed the decompression, we now turned attention to  arthrodesis.  I incised the L4-L5 intervertebral disc with using a #15  blade scalpel.  I performed a partial intervertebral discectomy using  the pituitary forceps.  I then prepared the vertebral endplates for  fusion.  By using curettes, we removed the soft tissue.  We then used  trial spacers and determined to use a Capstone PEEK interbody  prosthesis.  We prefilled the prosthesis with combination of local  autograft bone and VITOSS bone-graft extender.  I then inserted the  prosthesis  into the interspace; of course, after retracting the neural  structures medially out of harm's way.  We then used the tamps to turn  the prosthesis sideways completing the transforaminal lumbar interbody  fusion.   I should mention that we filled anterior and posterior disc space with  combination of local autograft bone and VITOSS bone-graft extender  completing the lumbar interbody fusion.   We now turned attention to the instrumentation.  Under fluoroscopic  guidance, I cannulated the bilateral L4 pedicles with the bone probe.  Tapped the pedicles with a 5.5-mm tap and probed inside the tapped  pedicles to rule out cortical breaches.  I Then inserted a 6.5 mm x 50  mm pedicle screws bilaterally at L4 under fluoroscopic guidance and  palpated along the medial aspect of the L4 pedicle and noted that there  were no cortical breeches.  The L4 nerve roots were not injured.  We  then connected unilateral pedicle screws with a lordotic rod, which went  from L4 down to S1.  We secured the rod in place with the caps which we  tightened appropriately.  We did this bilaterally completing the  instrumentation.   We now turned attention to posterolateral arthrodesis.  I used a high-  speed drill to decorticate the remainder of the L4-L5 facet and pars  region and transverse process at L4-L5.  We laid a combination of local  morselized autograft bone and VITOSS over these decorticated  posterolateral structures completing the posterolateral arthrodesis.   We then obtained hemostasis using bipolar cautery.  We palpated along  the ventral surface of the thecal sac along the exit route of the L4 and  L5 nerve roots bilaterally and noted they were well decompressed.  We  then irrigated the wound out with bacitracin solution and then removed  the retractor and then reapproximated the patient's thoracolumbar fascia  with interrupted #1 Vicryl suture, subcutaneous tissue with interrupted  2-0 Vicryl  suture, and the skin with Steri-Strips and Benzoin.  The  wound was then coated with bacitracin ointment and sterile dressing was  applied.  The drapes were removed.  The patient was subsequently turned  to the supine position where she was extubated by the anesthesia team  and transported to the post-anesthesia care unit in a stable condition.  All sponge, instrument, and needle counts were correct at the end of  this case.      Cristi Loron, M.D.  Electronically Signed     JDJ/MEDQ  D:  04/25/2007  T:  04/26/2007  Job:  [id number]

## 2011-03-25 LAB — ABO/RH: ABO/RH(D): O POS

## 2011-03-25 LAB — BASIC METABOLIC PANEL
BUN: 4 — ABNORMAL LOW
BUN: 8
Calcium: 8.2 — ABNORMAL LOW
Chloride: 98
GFR calc Af Amer: >60
GFR calc non Af Amer: >60
GFR calc non Af Amer: >60
GFR calc non Af Amer: >60
Potassium: 2.8 — ABNORMAL LOW
Potassium: 3.7
Potassium: 3.8
Sodium: 132 — ABNORMAL LOW
Sodium: 133 — ABNORMAL LOW
Sodium: 137

## 2011-03-25 LAB — CBC
HCT: 32.1 — ABNORMAL LOW
HCT: 39
Hemoglobin: 11 — ABNORMAL LOW
Hemoglobin: 13.2
Platelets: 210
RBC: 4.64
WBC: 10.7 — ABNORMAL HIGH
WBC: 13.3 — ABNORMAL HIGH

## 2011-03-25 LAB — TYPE AND SCREEN: Antibody Screen: NEGATIVE

## 2011-05-19 ENCOUNTER — Other Ambulatory Visit: Payer: Self-pay | Admitting: Neurosurgery

## 2011-06-19 ENCOUNTER — Encounter (HOSPITAL_COMMUNITY): Payer: Self-pay

## 2011-06-24 ENCOUNTER — Encounter (HOSPITAL_COMMUNITY)
Admission: RE | Admit: 2011-06-24 | Discharge: 2011-06-24 | Disposition: A | Discharge status: Home / Self Care | Payer: Medicare Other | Source: Ambulatory Visit | Attending: Neurosurgery | Admitting: Neurosurgery

## 2011-06-24 ENCOUNTER — Other Ambulatory Visit: Payer: Self-pay

## 2011-06-24 ENCOUNTER — Encounter (HOSPITAL_COMMUNITY): Payer: Self-pay

## 2011-06-24 LAB — SURGICAL PCR SCREEN
MRSA, PCR: NEGATIVE
Staphylococcus aureus: POSITIVE — AB

## 2011-06-24 LAB — BASIC METABOLIC PANEL
CO2: 27 mEq/L (ref 19–32)
Calcium: 9.8 mg/dL (ref 8.4–10.5)
Creatinine, Ser: 0.71 mg/dL (ref 0.50–1.10)
GFR calc non Af Amer: >90 mL/min (ref >90)
Sodium: 136 mEq/L (ref 135–145)

## 2011-06-24 LAB — CBC
MCV: 83.2 fL (ref 78.0–100.0)
Platelets: 272 10*3/uL (ref 150–400)
RBC: 5.05 MIL/uL (ref 3.87–5.11)
RDW: 12.9 % (ref 11.5–15.5)
WBC: 12.7 10*3/uL — ABNORMAL HIGH (ref 4.0–10.5)

## 2011-06-24 NOTE — Pre-Procedure Instructions (Signed)
21 [name]  06/24/2011   Your procedure is scheduled on:  JAN 16  Report to [facility] Stay [facility] at 0530 AM.  Call this number if you have problems the morning of surgery: [phone]   Remember:   Do not eat food:After Midnight.  May have clear liquids: up to 4 Hours before arrival.  Clear liquids include soda, tea, black coffee, apple or grape juice, broth.  Take these medicines the morning of surgery with A SIP OF WATER: OMEPRAZOLE,EFFEXOR,HYDRCODONE   Do not wear jewelry, make-up or nail polish.  Do not wear lotions, powders, or perfumes. You may wear deodorant.  Do not shave 48 hours prior to surgery.  Do not bring valuables to the hospital.  Contacts, dentures or bridgework may not be worn into surgery.  Leave suitcase in the car. After surgery it may be brought to your room.  For patients admitted to the hospital, checkout time is 11:00 AM the day of discharge.   Patients discharged the day of surgery will not be allowed to drive home.  Name and phone number of your driver: Micheline Chapman  Special Instructions: CHG Shower Use Special Wash: 1/2 bottle night before surgery and 1/2 bottle morning of surgery.   Please read over the following fact sheets that you were given: Pain Booklet, Coughing and Deep Breathing, MRSA Information and Surgical Site Infection Prevention

## 2011-07-01 MED ORDER — CEFAZOLIN SODIUM-DEXTROSE 2-3 GM-% IV SOLR
2.0000 g | INTRAVENOUS | Status: AC
  Administered 2011-07-02: 2 g via INTRAVENOUS
  Filled 2011-07-01: qty 50

## 2011-07-02 ENCOUNTER — Ambulatory Visit (HOSPITAL_COMMUNITY): Payer: Medicare Other

## 2011-07-02 ENCOUNTER — Encounter (HOSPITAL_COMMUNITY): Payer: Self-pay | Admitting: Anesthesiology

## 2011-07-02 ENCOUNTER — Inpatient Hospital Stay (HOSPITAL_COMMUNITY)
Admission: RE | Admit: 2011-07-02 | Discharge: 2011-07-03 | DRG: 472 | Disposition: A | Discharge status: Home / Self Care | Payer: Medicare Other | Source: Ambulatory Visit | Attending: Neurosurgery | Admitting: Neurosurgery

## 2011-07-02 ENCOUNTER — Encounter (HOSPITAL_COMMUNITY)
Admission: RE | Disposition: A | Discharge status: Home / Self Care | Payer: Self-pay | Source: Ambulatory Visit | Attending: Neurosurgery

## 2011-07-02 ENCOUNTER — Encounter (HOSPITAL_COMMUNITY): Payer: Self-pay | Admitting: *Deleted

## 2011-07-02 ENCOUNTER — Ambulatory Visit (HOSPITAL_COMMUNITY): Payer: Medicare Other | Admitting: Anesthesiology

## 2011-07-02 DIAGNOSIS — E119 Type 2 diabetes mellitus without complications: Secondary | ICD-10-CM | POA: Diagnosis present

## 2011-07-02 DIAGNOSIS — M4802 Spinal stenosis, cervical region: Secondary | ICD-10-CM | POA: Diagnosis present

## 2011-07-02 DIAGNOSIS — K219 Gastro-esophageal reflux disease without esophagitis: Secondary | ICD-10-CM | POA: Diagnosis present

## 2011-07-02 DIAGNOSIS — F17200 Nicotine dependence, unspecified, uncomplicated: Secondary | ICD-10-CM | POA: Diagnosis present

## 2011-07-02 DIAGNOSIS — M5020 Other cervical disc displacement, unspecified cervical region: Secondary | ICD-10-CM

## 2011-07-02 DIAGNOSIS — M5000 Cervical disc disorder with myelopathy, unspecified cervical region: Principal | ICD-10-CM | POA: Diagnosis present

## 2011-07-02 DIAGNOSIS — M4712 Other spondylosis with myelopathy, cervical region: Secondary | ICD-10-CM | POA: Diagnosis present

## 2011-07-02 DIAGNOSIS — Z794 Long term (current) use of insulin: Secondary | ICD-10-CM

## 2011-07-02 HISTORY — PX: ANTERIOR CERVICAL DECOMP/DISCECTOMY FUSION: SHX1161

## 2011-07-02 LAB — GLUCOSE, CAPILLARY

## 2011-07-02 SURGERY — ANTERIOR CERVICAL DECOMPRESSION/DISCECTOMY FUSION 1 LEVEL
Anesthesia: General | Site: Neck | Laterality: Bilateral | Wound class: Clean

## 2011-07-02 MED ORDER — CEFAZOLIN SODIUM 1-5 GM-% IV SOLN
1.0000 g | Freq: Three times a day (TID) | INTRAVENOUS | Status: AC
  Administered 2011-07-02 – 2011-07-03 (×2): 1 g via INTRAVENOUS
  Filled 2011-07-02 (×2): qty 50

## 2011-07-02 MED ORDER — INSULIN ASPART 100 UNIT/ML ~~LOC~~ SOLN
10.0000 [IU] | Freq: Two times a day (BID) | SUBCUTANEOUS | Status: DC
  Administered 2011-07-03: 10 [IU] via SUBCUTANEOUS

## 2011-07-02 MED ORDER — INSULIN ASPART 100 UNIT/ML ~~LOC~~ SOLN
0.0000 [IU] | Freq: Every day | SUBCUTANEOUS | Status: DC
  Administered 2011-07-02: 4 [IU] via SUBCUTANEOUS

## 2011-07-02 MED ORDER — FUROSEMIDE 40 MG PO TABS
40.0000 mg | ORAL_TABLET | Freq: Every day | ORAL | Status: DC
  Administered 2011-07-02: 40 mg via ORAL
  Filled 2011-07-02 (×2): qty 1

## 2011-07-02 MED ORDER — MIDAZOLAM HCL 5 MG/5ML IJ SOLN
INTRAMUSCULAR | Status: DC | PRN
  Administered 2011-07-02: 2 mg via INTRAVENOUS

## 2011-07-02 MED ORDER — SODIUM CHLORIDE 0.9 % IR SOLN
Status: DC | PRN
  Administered 2011-07-02: 13:00:00

## 2011-07-02 MED ORDER — HYDROMORPHONE HCL PF 1 MG/ML IJ SOLN
INTRAMUSCULAR | Status: AC
  Filled 2011-07-02: qty 1

## 2011-07-02 MED ORDER — 0.9 % SODIUM CHLORIDE (POUR BTL) OPTIME
TOPICAL | Status: DC | PRN
  Administered 2011-07-02: 1000 mL

## 2011-07-02 MED ORDER — INSULIN GLARGINE 100 UNIT/ML ~~LOC~~ SOLN
45.0000 [IU] | SUBCUTANEOUS | Status: DC
  Administered 2011-07-02 – 2011-07-03 (×2): 45 [IU] via SUBCUTANEOUS
  Filled 2011-07-02: qty 3

## 2011-07-02 MED ORDER — POLYETHYLENE GLYCOL 3350 17 G PO PACK
34.0000 g | PACK | Freq: Every day | ORAL | Status: DC
  Filled 2011-07-02: qty 2

## 2011-07-02 MED ORDER — OXYCODONE-ACETAMINOPHEN 5-325 MG PO TABS
1.0000 | ORAL_TABLET | ORAL | Status: DC | PRN
Start: 2011-07-02 — End: 2011-07-03
  Administered 2011-07-02 – 2011-07-03 (×5): 2 via ORAL
  Filled 2011-07-02 (×4): qty 2

## 2011-07-02 MED ORDER — THROMBIN 5000 UNITS EX KIT
PACK | CUTANEOUS | Status: DC | PRN
  Administered 2011-07-02 (×2): 5000 [IU] via TOPICAL

## 2011-07-02 MED ORDER — LACTATED RINGERS IV SOLN
INTRAVENOUS | Status: DC | PRN
  Administered 2011-07-02 (×2): via INTRAVENOUS

## 2011-07-02 MED ORDER — GLYCOPYRROLATE 0.2 MG/ML IJ SOLN
INTRAMUSCULAR | Status: DC | PRN
  Administered 2011-07-02: .8 mg via INTRAVENOUS

## 2011-07-02 MED ORDER — PHENOL 1.4 % MT LIQD: 1.0000 | OROMUCOSAL | Status: DC | PRN

## 2011-07-02 MED ORDER — PANTOPRAZOLE SODIUM 40 MG PO TBEC: 80.0000 mg | DELAYED_RELEASE_TABLET | Freq: Every day | ORAL | Status: DC

## 2011-07-02 MED ORDER — BACITRACIN ZINC 500 UNIT/GM EX OINT
TOPICAL_OINTMENT | CUTANEOUS | Status: DC | PRN
  Administered 2011-07-02: 1 via TOPICAL

## 2011-07-02 MED ORDER — HYDROCODONE-ACETAMINOPHEN 10-325 MG PO TABS: 1.0000 | ORAL_TABLET | ORAL | Status: DC | PRN

## 2011-07-02 MED ORDER — ONDANSETRON HCL 4 MG/2ML IJ SOLN: 4.0000 mg | INTRAMUSCULAR | Status: DC | PRN

## 2011-07-02 MED ORDER — POTASSIUM CHLORIDE CRYS ER 20 MEQ PO TBCR
20.0000 meq | EXTENDED_RELEASE_TABLET | Freq: Every day | ORAL | Status: DC
  Administered 2011-07-02: 20 meq via ORAL
  Filled 2011-07-02 (×2): qty 1

## 2011-07-02 MED ORDER — DOCUSATE SODIUM 100 MG PO CAPS
100.0000 mg | ORAL_CAPSULE | Freq: Two times a day (BID) | ORAL | Status: DC
  Administered 2011-07-02 – 2011-07-03 (×2): 100 mg via ORAL
  Filled 2011-07-02 (×2): qty 1

## 2011-07-02 MED ORDER — DIAZEPAM 5 MG PO TABS
5.0000 mg | ORAL_TABLET | Freq: Four times a day (QID) | ORAL | Status: DC | PRN
  Administered 2011-07-02 – 2011-07-03 (×3): 5 mg via ORAL
  Filled 2011-07-02 (×2): qty 1

## 2011-07-02 MED ORDER — METFORMIN HCL 500 MG PO TABS
500.0000 mg | ORAL_TABLET | Freq: Two times a day (BID) | ORAL | Status: DC
  Administered 2011-07-02 – 2011-07-03 (×2): 500 mg via ORAL
  Filled 2011-07-02 (×4): qty 1

## 2011-07-02 MED ORDER — FENTANYL CITRATE 0.05 MG/ML IJ SOLN
INTRAMUSCULAR | Status: DC | PRN
  Administered 2011-07-02 (×4): 50 ug via INTRAVENOUS
  Administered 2011-07-02: 100 ug via INTRAVENOUS

## 2011-07-02 MED ORDER — OXYCODONE-ACETAMINOPHEN 5-325 MG PO TABS
ORAL_TABLET | ORAL | Status: AC
  Filled 2011-07-02: qty 2

## 2011-07-02 MED ORDER — MORPHINE SULFATE 4 MG/ML IJ SOLN
1.0000 mg | INTRAMUSCULAR | Status: DC | PRN
  Administered 2011-07-02 – 2011-07-03 (×3): 4 mg via INTRAVENOUS
  Filled 2011-07-02 (×3): qty 1

## 2011-07-02 MED ORDER — ACETAMINOPHEN 325 MG PO TABS: 650.0000 mg | ORAL_TABLET | ORAL | Status: DC | PRN

## 2011-07-02 MED ORDER — PROPOFOL 10 MG/ML IV EMUL
INTRAVENOUS | Status: DC | PRN
  Administered 2011-07-02: 200 mg via INTRAVENOUS

## 2011-07-02 MED ORDER — NEOSTIGMINE METHYLSULFATE 1 MG/ML IJ SOLN
INTRAMUSCULAR | Status: DC | PRN
  Administered 2011-07-02: 4 mg via INTRAVENOUS

## 2011-07-02 MED ORDER — DEXAMETHASONE 4 MG PO TABS
4.0000 mg | ORAL_TABLET | Freq: Four times a day (QID) | ORAL | Status: AC
  Administered 2011-07-02: 4 mg via ORAL
  Filled 2011-07-02: qty 1

## 2011-07-02 MED ORDER — INSULIN ASPART 100 UNIT/ML ~~LOC~~ SOLN
0.0000 [IU] | SUBCUTANEOUS | Status: DC
  Administered 2011-07-02: 12 [IU] via SUBCUTANEOUS
  Filled 2011-07-02: qty 3

## 2011-07-02 MED ORDER — INSULIN ASPART 100 UNIT/ML ~~LOC~~ SOLN
0.0000 [IU] | Freq: Three times a day (TID) | SUBCUTANEOUS | Status: DC
  Administered 2011-07-03: 15 [IU] via SUBCUTANEOUS

## 2011-07-02 MED ORDER — DEXAMETHASONE SODIUM PHOSPHATE 4 MG/ML IJ SOLN: 4.0000 mg | Freq: Four times a day (QID) | INTRAMUSCULAR | Status: AC

## 2011-07-02 MED ORDER — ZOLPIDEM TARTRATE 5 MG PO TABS: 10.0000 mg | ORAL_TABLET | Freq: Every evening | ORAL | Status: DC | PRN

## 2011-07-02 MED ORDER — ACETAMINOPHEN 650 MG RE SUPP: 650.0000 mg | RECTAL | Status: DC | PRN

## 2011-07-02 MED ORDER — GLIPIZIDE 5 MG PO TABS
5.0000 mg | ORAL_TABLET | Freq: Every day | ORAL | Status: DC
  Administered 2011-07-03: 5 mg via ORAL
  Filled 2011-07-02 (×2): qty 1

## 2011-07-02 MED ORDER — MENTHOL 3 MG MT LOZG: 1.0000 | LOZENGE | OROMUCOSAL | Status: DC | PRN

## 2011-07-02 MED ORDER — ONDANSETRON HCL 4 MG/2ML IJ SOLN
INTRAMUSCULAR | Status: DC | PRN
  Administered 2011-07-02: 4 mg via INTRAVENOUS

## 2011-07-02 MED ORDER — DROPERIDOL 2.5 MG/ML IJ SOLN
0.6250 mg | INTRAMUSCULAR | Status: DC | PRN
Start: 2011-07-02 — End: 2011-07-02

## 2011-07-02 MED ORDER — LACTATED RINGERS IV SOLN: INTRAVENOUS | Status: DC

## 2011-07-02 MED ORDER — HEMOSTATIC AGENTS (NO CHARGE) OPTIME
TOPICAL | Status: DC | PRN
  Administered 2011-07-02: 1 via TOPICAL

## 2011-07-02 MED ORDER — HYDROMORPHONE HCL PF 1 MG/ML IJ SOLN
0.5000 mg | INTRAMUSCULAR | Status: AC
  Administered 2011-07-02 (×2): 0.5 mg via INTRAVENOUS

## 2011-07-02 MED ORDER — DIAZEPAM 5 MG PO TABS
ORAL_TABLET | ORAL | Status: AC
  Filled 2011-07-02: qty 1

## 2011-07-02 MED ORDER — VENLAFAXINE HCL ER 150 MG PO CP24
150.0000 mg | ORAL_CAPSULE | Freq: Every day | ORAL | Status: DC
  Administered 2011-07-03: 150 mg via ORAL
  Filled 2011-07-02 (×2): qty 1

## 2011-07-02 MED ORDER — ALBUTEROL SULFATE HFA 108 (90 BASE) MCG/ACT IN AERS
INHALATION_SPRAY | RESPIRATORY_TRACT | Status: DC | PRN
  Administered 2011-07-02: 4 via RESPIRATORY_TRACT

## 2011-07-02 MED ORDER — HYDROMORPHONE HCL PF 1 MG/ML IJ SOLN
0.2500 mg | INTRAMUSCULAR | Status: DC | PRN
  Administered 2011-07-02 (×3): 0.5 mg via INTRAVENOUS

## 2011-07-02 MED ORDER — LIDOCAINE-EPINEPHRINE 1 %-1:100000 IJ SOLN
INTRAMUSCULAR | Status: DC | PRN
  Administered 2011-07-02: 20 mL

## 2011-07-02 MED ORDER — VECURONIUM BROMIDE 10 MG IV SOLR
INTRAVENOUS | Status: DC | PRN
  Administered 2011-07-02: 7 mg via INTRAVENOUS
  Administered 2011-07-02: 4 mg via INTRAVENOUS

## 2011-07-02 SURGICAL SUPPLY — 63 items
BAG DECANTER FOR FLEXI CONT (MISCELLANEOUS) ×2 IMPLANT
BENZOIN TINCTURE PRP APPL 2/3 (GAUZE/BANDAGES/DRESSINGS) ×2 IMPLANT
BIT DRILL SM SPINE QC 12 (BIT) ×2 IMPLANT
BLADE SURG 15 STRL LF DISP TIS (BLADE) IMPLANT
BLADE SURG 15 STRL SS (BLADE)
BLADE ULTRA TIP 2M (BLADE) ×2 IMPLANT
BRUSH SCRUB EZ PLAIN DRY (MISCELLANEOUS) ×2 IMPLANT
BUR BARREL STRAIGHT FLUTE 4.0 (BURR) ×2 IMPLANT
BUR MATCHSTICK NEURO 3.0 LAGG (BURR) ×2 IMPLANT
CANISTER SUCTION 2500CC (MISCELLANEOUS) ×2 IMPLANT
CLOTH BEACON ORANGE TIMEOUT ST (SAFETY) ×2 IMPLANT
CONT SPEC 4OZ CLIKSEAL STRL BL (MISCELLANEOUS) ×2 IMPLANT
COVER MAYO STAND STRL (DRAPES) ×2 IMPLANT
DRAPE LAPAROTOMY 100X72 PEDS (DRAPES) ×2 IMPLANT
DRAPE MICROSCOPE LEICA (MISCELLANEOUS) IMPLANT
DRAPE POUCH INSTRU U-SHP 10X18 (DRAPES) ×2 IMPLANT
DRAPE SURG 17X23 STRL (DRAPES) ×4 IMPLANT
ELECT REM PT RETURN 9FT ADLT (ELECTROSURGICAL) ×2
ELECTRODE REM PT RTRN 9FT ADLT (ELECTROSURGICAL) ×1 IMPLANT
GAUZE SPONGE 4X4 12PLY STRL LF (GAUZE/BANDAGES/DRESSINGS) ×2 IMPLANT
GAUZE SPONGE 4X4 16PLY XRAY LF (GAUZE/BANDAGES/DRESSINGS) IMPLANT
GLOVE BIO SURGEON STRL SZ8.5 (GLOVE) ×2 IMPLANT
GLOVE BIOGEL PI IND STRL 7.0 (GLOVE) ×1 IMPLANT
GLOVE BIOGEL PI INDICATOR 7.0 (GLOVE) ×1
GLOVE ECLIPSE 6.5 STRL STRAW (GLOVE) ×2 IMPLANT
GLOVE ECLIPSE 7.0 STRL STRAW (GLOVE) ×2 IMPLANT
GLOVE EXAM NITRILE LRG STRL (GLOVE) IMPLANT
GLOVE EXAM NITRILE MD LF STRL (GLOVE) ×2 IMPLANT
GLOVE EXAM NITRILE XL STR (GLOVE) IMPLANT
GLOVE EXAM NITRILE XS STR PU (GLOVE) IMPLANT
GLOVE SS BIOGEL STRL SZ 6.5 (GLOVE) ×2 IMPLANT
GLOVE SS BIOGEL STRL SZ 8 (GLOVE) ×1 IMPLANT
GLOVE SUPERSENSE BIOGEL SZ 6.5 (GLOVE) ×2
GLOVE SUPERSENSE BIOGEL SZ 8 (GLOVE) ×1
GLOVE SURG SS PI 6.5 STRL IVOR (GLOVE) ×2 IMPLANT
GOWN BRE IMP SLV AUR LG STRL (GOWN DISPOSABLE) ×2 IMPLANT
GOWN BRE IMP SLV AUR XL STRL (GOWN DISPOSABLE) ×2 IMPLANT
KIT BASIN OR (CUSTOM PROCEDURE TRAY) ×2 IMPLANT
KIT ROOM TURNOVER OR (KITS) ×2 IMPLANT
MARKER SKIN DUAL TIP RULER LAB (MISCELLANEOUS) ×2 IMPLANT
NEEDLE HYPO 22GX1.5 SAFETY (NEEDLE) ×2 IMPLANT
NEEDLE SPNL 18GX3.5 QUINCKE PK (NEEDLE) ×2 IMPLANT
NS IRRIG 1000ML POUR BTL (IV SOLUTION) ×2 IMPLANT
PACK LAMINECTOMY NEURO (CUSTOM PROCEDURE TRAY) ×2 IMPLANT
PATTIES SURGICAL .5 X.5 (GAUZE/BANDAGES/DRESSINGS) ×2 IMPLANT
PEEK VISTA 14X14X8MM (Peek) ×2 IMPLANT
PIN DISTRACTION 14MM (PIN) ×4 IMPLANT
PLATE ANT CERV XTEND 1 LV 12 (Plate) ×2 IMPLANT
PUTTY 2.5ML ACTIFUSE ABX (Putty) ×2 IMPLANT
RUBBERBAND STERILE (MISCELLANEOUS) IMPLANT
SCREW XTD VAR 4.2 SELF TAP 12 (Screw) ×8 IMPLANT
SPONGE GAUZE 4X4 12PLY (GAUZE/BANDAGES/DRESSINGS) ×2 IMPLANT
SPONGE INTESTINAL PEANUT (DISPOSABLE) ×4 IMPLANT
SPONGE SURGIFOAM ABS GEL SZ50 (HEMOSTASIS) ×2 IMPLANT
STRIP CLOSURE SKIN 1/2X4 (GAUZE/BANDAGES/DRESSINGS) ×2 IMPLANT
SUT VIC AB 0 CT1 27 (SUTURE) ×1
SUT VIC AB 0 CT1 27XBRD ANTBC (SUTURE) ×1 IMPLANT
SUT VIC AB 3-0 SH 8-18 (SUTURE) ×2 IMPLANT
SYR 20ML ECCENTRIC (SYRINGE) ×2 IMPLANT
TAPE HYPAFIX 4 X10 (GAUZE/BANDAGES/DRESSINGS) ×2 IMPLANT
TOWEL OR 17X24 6PK STRL BLUE (TOWEL DISPOSABLE) ×2 IMPLANT
TOWEL OR 17X26 10 PK STRL BLUE (TOWEL DISPOSABLE) ×2 IMPLANT
WATER STERILE IRR 1000ML POUR (IV SOLUTION) ×2 IMPLANT

## 2011-07-02 NOTE — Op Note (Signed)
Brief history: Patient is a 51 year old white female who is complaining of neck and left arm pain consistent with a cervical radiculopathy. She has failed medical management and was worked up with a cervical MRI. This demonstrated the patient had a herniated disc at C6-7. I discussed the various treatment options with the patient including surgery. Patient has weighed the risks, benefits, and alternatives to surgery decided proceed with the operation.  Preoperative diagnosis: C6-7 herniated nucleus pulposus, stenosis, cervical radiculopathy, cervical myelopathy, cervicalgia  Postoperative diagnosis: The same  Procedure: C6-7 Anterior cervical discectomy/decompression; C6-7 interbody arthrodesis with local morcellized autograft bone and Actifuse bone graft extender; insertion of interbody prosthesis at C6-7 (Zimmer peek interbody prosthesis); anterior cervical plating from C6-C7 with globus titanium plate  Surgeon: Dr. Delma Officer  Asst.: Barbaraann Barthel  Anesthesia: Gen. endotracheal  Estimated blood loss: 200 cc  Drains: None  Complications: None  Description of procedure: The patient was brought to the operating room by the anesthesia team. General endotracheal anesthesia was induced. A roll was placed under the patient's shoulders to keep the neck in the neutral position. The patient's anterior cervical region was then prepared with Betadine scrub and Betadine solution. Sterile drapes were applied.  The area to be incised was then injected with Marcaine with epinephrine solution. I then used a scalpel to make a transverse incision in the patient's left anterior neck. I used the Metzenbaum scissors to divide the platysmal muscle and then to dissect medial to the sternocleidomastoid muscle, jugular vein, and carotid artery. I carefully dissected down towards the anterior cervical spine identifying the esophagus and retracting it medially. Then using Kitner swabs to clear soft tissue from the  anterior cervical spine. We then inserted a bent spinal needle into the upper exposed intervertebral disc space. We then obtained intraoperative radiographs confirm our location.  I then used electrocautery to detach the medial border of the longus colli muscle bilaterally from the C6-7 intervertebral disc spaces. I then inserted the Caspar self-retaining retractor underneath the longus colli muscle bilaterally to provide exposure.  We then incised the intervertebral disc at [facility]. We then performed a partial intervertebral discectomy with a pituitary forceps and the Karlin curettes. I then inserted distraction screws into the vertebral bodies at C6 and C7. We then distracted the interspace. We then used the high-speed drill to decorticate the vertebral endplates at C6 and C7, to drill away the remainder of the intervertebral disc, to drill away some posterior spondylosis, and to thin out the posterior longitudinal ligament. I then incised ligament with the arachnoid knife. We then removed the ligament with a Kerrison punches undercutting the vertebral endplates and decompressing the thecal sac. We then performed foraminotomies about the bilateral C7 nerve roots. This completed the decompression at this level. We did encounter some dural bleeding from some large veins. He controlled this with bipolar electrocautery and Gelfoam.   We now turned our to attention to the interbody fusion. We used the trial spacers to determine the appropriate size for the interbody prosthesis. We then pre-filled prosthesis with a combination of local morcellized autograft bone that we obtained during decompression as well as Actifuse bone graft extender. We then inserted the prosthesis into the distracted interspace at C6-7. We then removed the distraction screws. There was a good snug fit of the prosthesis in the interspace.   Having completed the fusion we now turned attention to the anterior spinal instrumentation. We used  the high-speed drill to drill away some anterior spondylosis at the  disc spaces so that the plate lay down flat. We selected the appropriate length titanium anterior cervical plate. We laid it along the anterior aspect of the vertebral bodies from C6 and C7. We then drilled 12 mm holes at C6 and C7. We then secured the plate to the vertebral bodies by placing two 12 mm self-tapping screws at C6 and C7. We then obtained intraoperative radiograph. The demonstrating good position of the instrumentation. We therefore secured the screws the plate the locking each cam. This completed the instrumentation.  We then obtained hemostasis using bipolar electrocautery. We irrigated the wound out with bacitracin solution. We then removed the retractor. We inspected the esophagus for any damage. There was none apparent. We then reapproximated patient's platysmal muscle with interrupted 3-0 Vicryl suture. We then reapproximated the subcutaneous tissue with interrupted 3-0 Vicryl suture. The skin was reapproximated with Steri-Strips and benzoin. The wound was then covered with bacitracin ointment. A sterile dressing was applied. The drapes were removed. Patient was subsequently extubated by the anesthesia team and transported to the post anesthesia care unit in stable condition. All sponge instrument and needle counts were correct at the end of this case.

## 2011-07-02 NOTE — Preoperative (Signed)
Beta Blockers   Reason not to administer Beta Blockers:Not Applicable 

## 2011-07-02 NOTE — Anesthesia Preprocedure Evaluation (Addendum)
Anesthesia Evaluation  Patient identified by MRN, date of birth, ID band Patient awake    Reviewed: Allergy & Precautions, H&P , NPO status , Patient's Chart, lab work & pertinent test results  History of Anesthesia Complications Negative for: history of anesthetic complications  Airway       Dental   Pulmonary Current Smoker,  clear to auscultation  Pulmonary exam normal       Cardiovascular Regular Normal- Systolic murmurs    Neuro/Psych Negative Psych ROS   GI/Hepatic Neg liver ROS, GERD-  Controlled,  Endo/Other  Diabetes mellitus-, Insulin Dependent  Renal/GU negative Renal ROS     Musculoskeletal   Abdominal   Peds  Hematology   Anesthesia Other Findings   Reproductive/Obstetrics                           Anesthesia Physical Anesthesia Plan  ASA: II  Anesthesia Plan: General   Post-op Pain Management:    Induction: Intravenous  Airway Management Planned: Oral ETT  Additional Equipment:   Intra-op Plan:   Post-operative Plan: Extubation in OR  Informed Consent: I have reviewed the patients History and Physical, chart, labs and discussed the procedure including the risks, benefits and alternatives for the proposed anesthesia with the patient or authorized representative who has indicated his/her understanding and acceptance.   Dental advisory given  Plan Discussed with: CRNA, Anesthesiologist and Surgeon  Anesthesia Plan Comments:         Anesthesia Quick Evaluation

## 2011-07-02 NOTE — Progress Notes (Signed)
Subjective:  The patient is alert and pleasant. She looks well.  Objective: Vital signs in last 24 hours: Temp:  [97.9 F (36.6 C)] 97.9 F (36.6 C) (01/16 0814) Pulse Rate:  [87] 87  (01/16 0814) Resp:  [20] 20  (01/16 0814) BP: (117)/(79) 117/79 mmHg (01/16 0814) SpO2:  [95 %] 95 % (01/16 0814)  Intake/Output from previous day:   Intake/Output this shift: Total I/O In: 1800 [I.V.:1800] Out: 300 [Blood:300]   Physical exam: The patient is alert and oriented. She is moving all 4 extremities well. Her dressing is clean and dry without evidence of hematoma or shift.    Lab Results: No results found for this basename: WBC:2,HGB:2,HCT:2,PLT:2 in the last 72 hours BMET No results found for this basename: NA:2,K:2,CL:2,CO2:2,GLUCOSE:2,BUN:2,CREATININE:2,CALCIUM:2 in the last 72 hours  Studies/Results: No results found.  Assessment/Plan: Patient is doing well.  LOS: 0 days     [name] D 07/02/2011, 2:44 PM

## 2011-07-02 NOTE — H&P (Signed)
Subjective: Patient is a 51 year old white female has suffered from neck and left arm pain. She has failed medical management. He was worked up with a cervical MRI which demonstrated a herniated disc at C6-7. I discussed the various treatment options including surgery. [name] has decided proceed with the operation.   Past Medical History  Diagnosis Date  . Diabetes mellitus     x 12 yrs    Past Surgical History  Procedure Date  . Back surgery [id number]  . Carpal tunnel release   . Nissen fundoplication   . Hip arthroscopy w/ labral debridement     left  . Cholecystectomy   . Abdominal hysterectomy   . Bladder suspension   . Portacath placement   . Port-a-cath removal     No Known Allergies  History  Substance Use Topics  . Smoking status: Current Everyday Smoker -- 0.5 packs/day    Types: Cigarettes  . Smokeless tobacco: Not on file  . Alcohol Use: No    History reviewed. No pertinent family history. Prior to Admission medications   Medication Sig Start Date End Date Taking? Authorizing Provider  furosemide (LASIX) 40 MG tablet Take 40 mg by mouth daily.     Yes Historical Provider, MD  glipiZIDE (GLUCOTROL) 5 MG tablet Take 5 mg by mouth daily.     Yes Historical Provider, MD  HYDROcodone-acetaminophen (NORCO) 10-325 MG per tablet Take 1 tablet by mouth every 5 (five) hours as needed. For pain.    Yes Historical Provider, MD  insulin aspart (NOVOLOG FLEXPEN) 100 UNIT/ML injection Inject 10 Units into the skin 2 (two) times daily.     Yes Historical Provider, MD  insulin glargine (LANTUS) 100 UNIT/ML injection Inject 45 Units into the skin 2 (two) times daily.     Yes Historical Provider, MD  metFORMIN (GLUCOPHAGE) 500 MG tablet Take 500 mg by mouth 2 (two) times daily with a meal.     Yes Historical Provider, MD  Omega-3 Fatty Acids (FISH OIL PO) Take 1 capsule by mouth daily.     Yes Historical Provider, MD  omeprazole (PRILOSEC) 40 MG capsule Take 40 mg by mouth 2 (two)  times daily.     Yes Historical Provider, MD  polyethylene glycol (MIRALAX / GLYCOLAX) packet Take 34 g by mouth daily.     Yes Historical Provider, MD  potassium chloride SA (K-DUR,KLOR-CON) 20 MEQ tablet Take 20 mEq by mouth daily.     Yes Historical Provider, MD  venlafaxine (EFFEXOR-XR) 150 MG 24 hr capsule Take 150 mg by mouth daily.     Yes Historical Provider, MD  VITAMIN E PO Take 1 capsule by mouth daily.     Yes Historical Provider, MD     Review of Systems  Positive ROS: Negative except as above  All other systems have been reviewed and were otherwise negative with the exception of those mentioned in the HPI and as above.  Objective: Vital signs in last 24 hours: Temp:  [97.9 F (36.6 C)] 97.9 F (36.6 C) (01/16 0814) Pulse Rate:  [87] 87  (01/16 0814) Resp:  [20] 20  (01/16 0814) BP: (117)/(79) 117/79 mmHg (01/16 0814) SpO2:  [95 %] 95 % (01/16 0814)  General Appearance: Alert, cooperative, no distress, appears stated age Head: Normocephalic, without obvious abnormality, atraumatic Eyes: PERRL, conjunctiva/corneas clear, EOM's intact, fundi benign, both eyes      Ears: Normal TM's and external ear canals, both ears Throat: Lips, mucosa, and tongue normal; teeth and gums  normal Neck: Supple, symmetrical, trachea midline, no adenopathy; thyroid: No enlargement/tenderness/nodules; no carotid bruit or JVD. Patient has a positive Spurling's test on the left. Back: Symmetric, no curvature, ROM normal, no CVA tenderness Lungs: Clear to auscultation bilaterally, respirations unlabored Heart: Regular rate and rhythm, S1 and S2 normal, no murmur, rub or gallop Abdomen: Soft, non-tender, bowel sounds active all four quadrants, no masses, no organomegaly Extremities: Extremities normal, atraumatic, no cyanosis or edema Pulses: 2+ and symmetric all extremities Skin: Skin color, texture, turgor normal, no rashes or lesions  NEUROLOGIC:   Mental status: alert and oriented, no  aphasia, good attention span, Fund of knowledge/ memory ok Motor Exam - grossly normal Sensory Exam - grossly normal Reflexes:  Coordination - grossly normal Gait - grossly normal Balance - grossly normal Cranial Nerves: I: smell Not tested  II: visual acuity  OS: Normal    OD: Normal   II: visual fields Full to confrontation  II: pupils Equal, round, reactive to light  III,VII: ptosis None  III,IV,VI: extraocular muscles  Full ROM  V: mastication Normal  V: facial light touch sensation  Normal  V,VII: corneal reflex  Present  VII: facial muscle function - upper  Normal  VII: facial muscle function - lower Normal  VIII: hearing Not tested  IX: soft palate elevation  Normal  IX,X: gag reflex Present  XI: trapezius strength  5/5  XI: sternocleidomastoid strength 5/5  XI: neck flexion strength  5/5  XII: tongue strength  Normal    Data Review Lab Results  Component Value Date   WBC 12.7* 06/24/2011   HGB 14.2 06/24/2011   HCT 42.0 06/24/2011   MCV 83.2 06/24/2011   PLT 272 06/24/2011   Lab Results  Component Value Date   NA 136 06/24/2011   K 4.5 06/24/2011   CL 99 06/24/2011   CO2 27 06/24/2011   BUN 19 06/24/2011   CREATININE 0.71 06/24/2011   GLUCOSE 226* 06/24/2011   No results found for this basename: INR, PROTIME    Assessment/Plan: C67 herniated disposes, cervical adenopathy, cervicalgia, cervical stenosis: I discussed the situation with the patient. I reviewed her MR scan with her and pointed out the abnormalities. We have discussed the various treatment options including surgery. I described the surgical option of a C6-7 anterior cervicectomy fusion and plating. I've shown her surgical models. We have discussed the risks, benefits, alternatives and likelihood of achieving our goals with surgery. I have answered all the patient's questions. She has decided proceed with surgery.   Cailan General D 07/02/2011 11:44 AM

## 2011-07-02 NOTE — Plan of Care (Signed)
Problem: Consults Goal: Diagnosis - Spinal Surgery Outcome: Completed/Met Date Met:  07/02/11 Cervical Spine Fusion

## 2011-07-02 NOTE — Anesthesia Postprocedure Evaluation (Signed)
Anesthesia Post Note  Patient: [name]  Procedure(s) Performed:  ANTERIOR CERVICAL DECOMPRESSION/DISCECTOMY FUSION 1 LEVEL - Cervical six-seven anterior cervical decompression  fusion with interbody prosthesis and plating   Anesthesia type: general  Patient location: PACU  Post pain: Pain level controlled  Post assessment: Patient's Cardiovascular Status Stable  Last Vitals:  Filed Vitals:   07/02/11 1536  BP: 121/70  Pulse: 93  Temp:   Resp: 17    Post vital signs: Reviewed and stable  Level of consciousness: sedated  Complications: No apparent anesthesia complications

## 2011-07-02 NOTE — Transfer of Care (Signed)
Immediate Anesthesia Transfer of Care Note  Patient: [name]  Procedure(s) Performed:  ANTERIOR CERVICAL DECOMPRESSION/DISCECTOMY FUSION 1 LEVEL - Cervical six-seven anterior cervical decompression  fusion with interbody prosthesis and plating   Patient Location: PACU  Anesthesia Type: General  Level of Consciousness: awake, alert  and oriented  Airway & Oxygen Therapy: Patient Spontanous Breathing and Patient connected to nasal cannula oxygen  Post-op Assessment: Report given to PACU RN, Post -op Vital signs reviewed and stable and Patient moving all extremities X 4  Post vital signs: Reviewed and stable Filed Vitals:   07/02/11 0814  BP: 117/79  Pulse: 87  Temp: 36.6 C  Resp: 20    Complications: No apparent anesthesia complications

## 2011-07-03 ENCOUNTER — Encounter (HOSPITAL_COMMUNITY): Payer: Self-pay | Admitting: Neurosurgery

## 2011-07-03 LAB — GLUCOSE, CAPILLARY: Glucose-Capillary: 308 mg/dL — ABNORMAL HIGH (ref 70–99)

## 2011-07-03 MED ORDER — DSS 100 MG PO CAPS: 100.0000 mg | ORAL_CAPSULE | Freq: Two times a day (BID) | ORAL | Status: AC

## 2011-07-03 MED ORDER — NICOTINE 21 MG/24HR TD PT24: 1.0000 | MEDICATED_PATCH | TRANSDERMAL | Status: AC

## 2011-07-03 MED ORDER — OXYCODONE-ACETAMINOPHEN 10-325 MG PO TABS: 1.0000 | ORAL_TABLET | ORAL | Status: AC | PRN

## 2011-07-03 MED ORDER — DIAZEPAM 5 MG PO TABS: 5.0000 mg | ORAL_TABLET | Freq: Four times a day (QID) | ORAL | Status: AC | PRN

## 2011-07-03 MED FILL — Sodium Chloride Irrigation Soln 0.9%: Qty: 500 | Status: AC

## 2011-07-15 NOTE — Discharge Summary (Signed)
Physician Discharge Summary  Patient ID: [name] MRN: [id number] DOB/AGE: May 14, 1961 51 y.o.  Admit date: 07/02/2011 Discharge date: 07/15/2011  Admission Diagnoses:Cervical herniated disc   Discharge Diagnoses: same Active Problems:  * No active hospital problems. *    Discharged Condition: good  Hospital Course: I admitted the patient and performed a C6/7 anterior discectomy, fusion and plating on 07/02/11. The patient was subsequently discharged.  Consults:None Significant Diagnostic Studies:None Treatments:C6/7 ACD, fusion, plating Discharge Exam: Blood pressure 115/79, pulse 74, temperature 98.7 F (37.1 C), temperature source Oral, resp. rate 20, SpO2 95.00%. Normal strength, wound look good  Disposition: home  Discharge Orders    Future Orders Please Complete By Expires   Diet - low sodium heart healthy      Diet - low sodium heart healthy      Diet - low sodium heart healthy      Increase activity slowly      Increase activity slowly      Discharge instructions      Comments:   The patient was given oral and written discharge instructions. All her questions were answered. She was instructed to call [phone] for a followup appointment.   Remove dressing in 48 hours      Call MD for:  temperature >100.4      Call MD for:  persistant nausea and vomiting      Call MD for:  severe uncontrolled pain      Call MD for:  redness, tenderness, or signs of infection (pain, swelling, redness, odor or green/yellow discharge around incision site)      Call MD for:  difficulty breathing, headache or visual disturbances      Call MD for:  hives      Call MD for:  persistant dizziness or light-headedness      Call MD for:  extreme fatigue      Increase activity slowly      Remove dressing in 48 hours        Medication List  As of 07/15/2011  5:05 PM   STOP taking these medications         HYDROcodone-acetaminophen 10-325 MG per tablet         TAKE these  medications         FISH OIL PO   Take 1 capsule by mouth daily.      furosemide 40 MG tablet   Commonly known as: LASIX   Take 40 mg by mouth daily.      glipiZIDE 5 MG tablet   Commonly known as: GLUCOTROL   Take 5 mg by mouth daily.      insulin glargine 100 UNIT/ML injection   Commonly known as: LANTUS   Inject 45 Units into the skin 2 (two) times daily.      metFORMIN 500 MG tablet   Commonly known as: GLUCOPHAGE   Take 500 mg by mouth 2 (two) times daily with a meal.      nicotine 21 mg/24hr patch   Commonly known as: NICODERM CQ - dosed in mg/24 hours   Place 1 patch (21 patches total) onto the skin daily.      NOVOLOG FLEXPEN 100 UNIT/ML injection   Generic drug: insulin aspart   Inject 10 Units into the skin 2 (two) times daily.      omeprazole 40 MG capsule   Commonly known as: PRILOSEC   Take 40 mg by mouth 2 (two) times daily.  polyethylene glycol packet   Commonly known as: MIRALAX / GLYCOLAX   Take 34 g by mouth daily.      potassium chloride SA 20 MEQ tablet   Commonly known as: K-DUR,KLOR-CON   Take 20 mEq by mouth daily.      venlafaxine 150 MG 24 hr capsule   Commonly known as: EFFEXOR-XR   Take 150 mg by mouth daily.      VITAMIN E PO   Take 1 capsule by mouth daily.             SignedCristi Loron 07/15/2011, 5:05 PM

## 2015-02-09 ENCOUNTER — Emergency Department (HOSPITAL_COMMUNITY): Payer: Medicare Other

## 2015-02-09 ENCOUNTER — Encounter (HOSPITAL_COMMUNITY): Payer: Self-pay | Admitting: Emergency Medicine

## 2015-02-09 ENCOUNTER — Inpatient Hospital Stay (HOSPITAL_COMMUNITY)
Admission: EM | Admit: 2015-02-09 | Discharge: 2015-02-11 | DRG: 392 | Disposition: A | Discharge status: Home / Self Care | Payer: Medicare Other | Attending: Internal Medicine | Admitting: Internal Medicine

## 2015-02-09 DIAGNOSIS — Z794 Long term (current) use of insulin: Secondary | ICD-10-CM | POA: Diagnosis not present

## 2015-02-09 DIAGNOSIS — K5900 Constipation, unspecified: Secondary | ICD-10-CM | POA: Diagnosis present

## 2015-02-09 DIAGNOSIS — K529 Noninfective gastroenteritis and colitis, unspecified: Principal | ICD-10-CM | POA: Diagnosis present

## 2015-02-09 DIAGNOSIS — I10 Essential (primary) hypertension: Secondary | ICD-10-CM | POA: Diagnosis present

## 2015-02-09 DIAGNOSIS — IMO0002 Reserved for concepts with insufficient information to code with codable children: Secondary | ICD-10-CM | POA: Diagnosis present

## 2015-02-09 DIAGNOSIS — K625 Hemorrhage of anus and rectum: Secondary | ICD-10-CM | POA: Diagnosis present

## 2015-02-09 DIAGNOSIS — Z79899 Other long term (current) drug therapy: Secondary | ICD-10-CM

## 2015-02-09 DIAGNOSIS — F17210 Nicotine dependence, cigarettes, uncomplicated: Secondary | ICD-10-CM | POA: Diagnosis present

## 2015-02-09 DIAGNOSIS — E785 Hyperlipidemia, unspecified: Secondary | ICD-10-CM | POA: Diagnosis present

## 2015-02-09 DIAGNOSIS — E1165 Type 2 diabetes mellitus with hyperglycemia: Secondary | ICD-10-CM | POA: Diagnosis present

## 2015-02-09 DIAGNOSIS — E1140 Type 2 diabetes mellitus with diabetic neuropathy, unspecified: Secondary | ICD-10-CM | POA: Diagnosis present

## 2015-02-09 LAB — CBC WITH DIFFERENTIAL/PLATELET
BASOS PCT: 0 % (ref 0–1)
Basophils Absolute: 0 10*3/uL (ref 0.0–0.1)
Basophils Absolute: 0 10*3/uL (ref 0.0–0.1)
Basophils Relative: 0 % (ref 0–1)
EOS ABS: 0.1 10*3/uL (ref 0.0–0.7)
EOS PCT: 0 % (ref 0–5)
Eosinophils Absolute: 0 10*3/uL (ref 0.0–0.7)
Eosinophils Relative: 0 % (ref 0–5)
HCT: 41.7 % (ref 36.0–46.0)
HCT: 37.1 % (ref 36.0–46.0)
HEMOGLOBIN: 13.8 g/dL (ref 12.0–15.0)
HEMOGLOBIN: 12.3 g/dL (ref 12.0–15.0)
LYMPHS ABS: 1.1 10*3/uL (ref 0.7–4.0)
LYMPHS ABS: 1 10*3/uL (ref 0.7–4.0)
LYMPHS PCT: 5 % — AB (ref 12–46)
Lymphocytes Relative: 5 % — ABNORMAL LOW (ref 12–46)
MCH: 27.4 pg (ref 26.0–34.0)
MCH: 27.5 pg (ref 26.0–34.0)
MCHC: 33.1 g/dL (ref 30.0–36.0)
MCHC: 33.2 g/dL (ref 30.0–36.0)
MCV: 82.7 fL (ref 78.0–100.0)
MCV: 82.8 fL (ref 78.0–100.0)
MONOS PCT: 6 % (ref 3–12)
Monocytes Absolute: 1.5 10*3/uL — ABNORMAL HIGH (ref 0.1–1.0)
Monocytes Absolute: 0.6 10*3/uL (ref 0.1–1.0)
Monocytes Relative: 3 % (ref 3–12)
NEUTROS ABS: 18 10*3/uL — AB (ref 1.7–7.7)
NEUTROS PCT: 89 % — AB (ref 43–77)
NEUTROS PCT: 92 % — AB (ref 43–77)
Neutro Abs: 20.7 10*3/uL — ABNORMAL HIGH (ref 1.7–7.7)
PLATELETS: 275 10*3/uL (ref 150–400)
Platelets: 276 10*3/uL (ref 150–400)
RBC: 5.04 MIL/uL (ref 3.87–5.11)
RBC: 4.48 MIL/uL (ref 3.87–5.11)
RDW: 12.6 % (ref 11.5–15.5)
RDW: 12.7 % (ref 11.5–15.5)
WBC: 23.3 10*3/uL — AB (ref 4.0–10.5)
WBC: 19.7 10*3/uL — AB (ref 4.0–10.5)

## 2015-02-09 LAB — COMPREHENSIVE METABOLIC PANEL
ALBUMIN: 4.2 g/dL (ref 3.5–5.0)
ALK PHOS: 99 U/L (ref 38–126)
ALT: 38 U/L (ref 14–54)
ANION GAP: 9 (ref 5–15)
AST: 32 U/L (ref 15–41)
BUN: 19 mg/dL (ref 6–20)
CHLORIDE: 103 mmol/L (ref 101–111)
CO2: 25 mmol/L (ref 22–32)
Calcium: 9.1 mg/dL (ref 8.9–10.3)
Creatinine, Ser: 0.7 mg/dL (ref 0.44–1.00)
GFR calc non Af Amer: >60 mL/min (ref >60)
GLUCOSE: 390 mg/dL — AB (ref 65–99)
Potassium: 4 mmol/L (ref 3.5–5.1)
SODIUM: 137 mmol/L (ref 135–145)
Total Bilirubin: 0.8 mg/dL (ref 0.3–1.2)
Total Protein: 7.4 g/dL (ref 6.5–8.1)

## 2015-02-09 LAB — HEPATIC FUNCTION PANEL
ALBUMIN: 3.5 g/dL (ref 3.5–5.0)
ALT: 33 U/L (ref 14–54)
AST: 25 U/L (ref 15–41)
Alkaline Phosphatase: 88 U/L (ref 38–126)
Bilirubin, Direct: <0.1 mg/dL — ABNORMAL LOW (ref 0.1–0.5)
TOTAL PROTEIN: 6.5 g/dL (ref 6.5–8.1)
Total Bilirubin: 0.3 mg/dL (ref 0.3–1.2)

## 2015-02-09 LAB — BASIC METABOLIC PANEL
Anion gap: 7 (ref 5–15)
BUN: 16 mg/dL (ref 6–20)
CHLORIDE: 106 mmol/L (ref 101–111)
CO2: 24 mmol/L (ref 22–32)
Calcium: 8.5 mg/dL — ABNORMAL LOW (ref 8.9–10.3)
Creatinine, Ser: 0.73 mg/dL (ref 0.44–1.00)
GFR calc Af Amer: >60 mL/min (ref >60)
GFR calc non Af Amer: >60 mL/min (ref >60)
GLUCOSE: 393 mg/dL — AB (ref 65–99)
POTASSIUM: 4.2 mmol/L (ref 3.5–5.1)
SODIUM: 137 mmol/L (ref 135–145)

## 2015-02-09 LAB — GLUCOSE, CAPILLARY
GLUCOSE-CAPILLARY: 313 mg/dL — AB (ref 65–99)
Glucose-Capillary: 125 mg/dL — ABNORMAL HIGH (ref 65–99)
Glucose-Capillary: 159 mg/dL — ABNORMAL HIGH (ref 65–99)

## 2015-02-09 LAB — LIPASE, BLOOD: Lipase: 17 U/L — ABNORMAL LOW (ref 22–51)

## 2015-02-09 LAB — URINALYSIS, ROUTINE W REFLEX MICROSCOPIC
Bilirubin Urine: NEGATIVE
Glucose, UA: >1000 mg/dL — AB
Hgb urine dipstick: NEGATIVE
Ketones, ur: 40 mg/dL — AB
LEUKOCYTES UA: NEGATIVE
NITRITE: NEGATIVE
PROTEIN: NEGATIVE mg/dL
SPECIFIC GRAVITY, URINE: 1.034 — AB (ref 1.005–1.030)
UROBILINOGEN UA: 0.2 mg/dL (ref 0.0–1.0)
pH: 5.5 (ref 5.0–8.0)

## 2015-02-09 LAB — URINE MICROSCOPIC-ADD ON

## 2015-02-09 LAB — TYPE AND SCREEN
ABO/RH(D): O POS
ANTIBODY SCREEN: NEGATIVE

## 2015-02-09 LAB — ABO/RH: ABO/RH(D): O POS

## 2015-02-09 LAB — I-STAT CG4 LACTIC ACID, ED: LACTIC ACID, VENOUS: 2.74 mmol/L — AB (ref 0.5–2.0)

## 2015-02-09 LAB — LACTIC ACID, PLASMA: LACTIC ACID, VENOUS: 1 mmol/L (ref 0.5–2.0)

## 2015-02-09 MED ORDER — INSULIN ASPART 100 UNIT/ML ~~LOC~~ SOLN
0.0000 [IU] | Freq: Three times a day (TID) | SUBCUTANEOUS | Status: DC
  Administered 2015-02-09: 3 [IU] via SUBCUTANEOUS
  Administered 2015-02-10: 4 [IU] via SUBCUTANEOUS
  Administered 2015-02-10: 7 [IU] via SUBCUTANEOUS
  Administered 2015-02-10 – 2015-02-11 (×2): 3 [IU] via SUBCUTANEOUS

## 2015-02-09 MED ORDER — HYDROMORPHONE HCL 1 MG/ML IJ SOLN
0.5000 mg | INTRAMUSCULAR | Status: DC | PRN
  Administered 2015-02-09 – 2015-02-10 (×6): 0.5 mg via INTRAVENOUS
  Filled 2015-02-09 (×6): qty 1

## 2015-02-09 MED ORDER — SODIUM CHLORIDE 0.9 % IV BOLUS (SEPSIS)
1000.0000 mL | Freq: Once | INTRAVENOUS | Status: AC
Start: 2015-02-09 — End: 2015-02-09
  Administered 2015-02-09: 1000 mL via INTRAVENOUS

## 2015-02-09 MED ORDER — ACETAMINOPHEN 650 MG RE SUPP: 650.0000 mg | Freq: Four times a day (QID) | RECTAL | Status: DC | PRN

## 2015-02-09 MED ORDER — INSULIN DETEMIR 100 UNIT/ML ~~LOC~~ SOLN
20.0000 [IU] | Freq: Two times a day (BID) | SUBCUTANEOUS | Status: DC
  Administered 2015-02-09: 20 [IU] via SUBCUTANEOUS
  Filled 2015-02-09 (×2): qty 0.2

## 2015-02-09 MED ORDER — METRONIDAZOLE IN NACL 5-0.79 MG/ML-% IV SOLN
500.0000 mg | Freq: Once | INTRAVENOUS | Status: AC
  Administered 2015-02-09: 500 mg via INTRAVENOUS
  Filled 2015-02-09: qty 100

## 2015-02-09 MED ORDER — SODIUM CHLORIDE 0.9 % IV BOLUS (SEPSIS)
1000.0000 mL | Freq: Once | INTRAVENOUS | Status: AC
  Administered 2015-02-09: 1000 mL via INTRAVENOUS

## 2015-02-09 MED ORDER — METOPROLOL SUCCINATE ER 25 MG PO TB24
25.0000 mg | ORAL_TABLET | Freq: Every day | ORAL | Status: DC
  Administered 2015-02-09 – 2015-02-11 (×3): 25 mg via ORAL
  Filled 2015-02-09 (×3): qty 1

## 2015-02-09 MED ORDER — ONDANSETRON HCL 4 MG/2ML IJ SOLN
4.0000 mg | Freq: Once | INTRAMUSCULAR | Status: AC
  Administered 2015-02-09: 4 mg via INTRAVENOUS
  Filled 2015-02-09: qty 2

## 2015-02-09 MED ORDER — CIPROFLOXACIN IN D5W 400 MG/200ML IV SOLN
400.0000 mg | Freq: Once | INTRAVENOUS | Status: AC
  Administered 2015-02-09: 400 mg via INTRAVENOUS
  Filled 2015-02-09: qty 200

## 2015-02-09 MED ORDER — CIPROFLOXACIN IN D5W 400 MG/200ML IV SOLN
400.0000 mg | Freq: Two times a day (BID) | INTRAVENOUS | Status: DC
  Administered 2015-02-09 – 2015-02-11 (×4): 400 mg via INTRAVENOUS
  Filled 2015-02-09 (×6): qty 200

## 2015-02-09 MED ORDER — IOHEXOL 300 MG/ML  SOLN
100.0000 mL | Freq: Once | INTRAMUSCULAR | Status: AC | PRN
  Administered 2015-02-09: 100 mL via INTRAVENOUS

## 2015-02-09 MED ORDER — INSULIN ASPART 100 UNIT/ML ~~LOC~~ SOLN
0.0000 [IU] | Freq: Three times a day (TID) | SUBCUTANEOUS | Status: DC
  Administered 2015-02-09: 7 [IU] via SUBCUTANEOUS
  Administered 2015-02-09: 1 [IU] via SUBCUTANEOUS

## 2015-02-09 MED ORDER — INSULIN ASPART 100 UNIT/ML ~~LOC~~ SOLN: 0.0000 [IU] | Freq: Every day | SUBCUTANEOUS | Status: DC

## 2015-02-09 MED ORDER — HYDRALAZINE HCL 20 MG/ML IJ SOLN: 10.0000 mg | INTRAMUSCULAR | Status: DC | PRN

## 2015-02-09 MED ORDER — HYDROMORPHONE HCL 1 MG/ML IJ SOLN
1.0000 mg | Freq: Once | INTRAMUSCULAR | Status: AC
  Administered 2015-02-09: 1 mg via INTRAVENOUS
  Filled 2015-02-09: qty 1

## 2015-02-09 MED ORDER — METRONIDAZOLE IN NACL 5-0.79 MG/ML-% IV SOLN
500.0000 mg | Freq: Three times a day (TID) | INTRAVENOUS | Status: DC
  Administered 2015-02-09 – 2015-02-11 (×6): 500 mg via INTRAVENOUS
  Filled 2015-02-09 (×7): qty 100

## 2015-02-09 MED ORDER — ATORVASTATIN CALCIUM 80 MG PO TABS
80.0000 mg | ORAL_TABLET | Freq: Every day | ORAL | Status: DC
  Administered 2015-02-09 – 2015-02-11 (×3): 80 mg via ORAL
  Filled 2015-02-09 (×3): qty 1

## 2015-02-09 MED ORDER — ACETAMINOPHEN 325 MG PO TABS
650.0000 mg | ORAL_TABLET | Freq: Four times a day (QID) | ORAL | Status: DC | PRN
  Administered 2015-02-09: 650 mg via ORAL
  Filled 2015-02-09: qty 2

## 2015-02-09 MED ORDER — GABAPENTIN 100 MG PO CAPS
100.0000 mg | ORAL_CAPSULE | Freq: Every day | ORAL | Status: DC
  Administered 2015-02-09 – 2015-02-10 (×2): 100 mg via ORAL
  Filled 2015-02-09 (×3): qty 1

## 2015-02-09 MED ORDER — ONDANSETRON HCL 4 MG PO TABS: 4.0000 mg | ORAL_TABLET | Freq: Four times a day (QID) | ORAL | Status: DC | PRN

## 2015-02-09 MED ORDER — MORPHINE SULFATE (PF) 4 MG/ML IV SOLN
4.0000 mg | Freq: Once | INTRAVENOUS | Status: AC
  Administered 2015-02-09: 4 mg via INTRAVENOUS
  Filled 2015-02-09: qty 1

## 2015-02-09 MED ORDER — VENLAFAXINE HCL ER 150 MG PO CP24
150.0000 mg | ORAL_CAPSULE | Freq: Every day | ORAL | Status: DC
  Administered 2015-02-09 – 2015-02-11 (×3): 150 mg via ORAL
  Filled 2015-02-09 (×3): qty 1

## 2015-02-09 MED ORDER — ONDANSETRON HCL 4 MG/2ML IJ SOLN
4.0000 mg | Freq: Four times a day (QID) | INTRAMUSCULAR | Status: DC | PRN
  Administered 2015-02-09 – 2015-02-10 (×2): 4 mg via INTRAVENOUS
  Filled 2015-02-09 (×2): qty 2

## 2015-02-09 MED ORDER — HYDROCORTISONE ACE-PRAMOXINE 1-1 % RE FOAM
1.0000 | Freq: Two times a day (BID) | RECTAL | Status: DC | PRN
  Filled 2015-02-09: qty 10

## 2015-02-09 MED ORDER — INSULIN DETEMIR 100 UNIT/ML ~~LOC~~ SOLN
30.0000 [IU] | Freq: Two times a day (BID) | SUBCUTANEOUS | Status: DC
Start: 2015-02-09 — End: 2015-02-11
  Administered 2015-02-09 – 2015-02-11 (×4): 30 [IU] via SUBCUTANEOUS
  Filled 2015-02-09 (×4): qty 0.3

## 2015-02-09 MED ORDER — SODIUM CHLORIDE 0.9 % IV SOLN
INTRAVENOUS | Status: DC
  Administered 2015-02-09: 17:00:00 via INTRAVENOUS
  Administered 2015-02-09 (×2): 125 mL/h via INTRAVENOUS

## 2015-02-09 MED ORDER — SODIUM CHLORIDE 0.9 % IV SOLN
INTRAVENOUS | Status: DC
  Administered 2015-02-09: 1000 mL via INTRAVENOUS

## 2015-02-09 MED ORDER — HYOSCYAMINE SULFATE 0.125 MG SL SUBL
0.2500 mg | SUBLINGUAL_TABLET | SUBLINGUAL | Status: DC | PRN
  Administered 2015-02-10 (×3): 0.25 mg via SUBLINGUAL
  Filled 2015-02-09 (×6): qty 2

## 2015-02-09 MED ORDER — IOHEXOL 300 MG/ML  SOLN
50.0000 mL | Freq: Once | INTRAMUSCULAR | Status: AC | PRN
  Administered 2015-02-09: 50 mL via ORAL

## 2015-02-09 MED ORDER — GLIPIZIDE 5 MG PO TABS
5.0000 mg | ORAL_TABLET | Freq: Every day | ORAL | Status: DC
  Administered 2015-02-09 – 2015-02-11 (×3): 5 mg via ORAL
  Filled 2015-02-09 (×4): qty 1

## 2015-02-09 NOTE — Progress Notes (Signed)
ANTIBIOTIC CONSULT NOTE - INITIAL  Pharmacy Consult for Cipro Indication: Colitis  No Known Allergies  Patient Measurements: Height:  (167.6 cm) Weight: 211 lb (95.709 kg) IBW/kg (Calculated) : 59.3  Vital Signs: Temp: 98.5 F (36.9 C) (08/26 0547) Temp Source: Oral (08/26 0547) BP: 157/88 mmHg (08/26 0310) Pulse Rate: 83 (08/26 0547) Intake/Output from previous day:   Intake/Output from this shift: Total I/O In: -  Out: 600 [Urine:600]  Labs:  Recent Labs  02/09/15 0330  WBC 23.3*  HGB 13.8  PLT 275  CREATININE 0.70   Estimated Creatinine Clearance: 96 mL/min (by C-G formula based on Cr of 0.7). No results for input(s): VANCOTROUGH, VANCOPEAK, VANCORANDOM, GENTTROUGH, GENTPEAK, GENTRANDOM, TOBRATROUGH, TOBRAPEAK, TOBRARND, AMIKACINPEAK, AMIKACINTROU, AMIKACIN in the last 72 hours.   Microbiology: No results found for this or any previous visit (from the past 720 hour(s)).  Medical History: Past Medical History  Diagnosis Date  . Diabetes mellitus     x 12 yrs   Medications:  Scheduled:  . atorvastatin  80 mg Oral Daily  . ciprofloxacin  400 mg Intravenous Q12H  . gabapentin  100 mg Oral QHS  . glipiZIDE  5 mg Oral QAC breakfast  . insulin aspart  0-9 Units Subcutaneous TID WC  . insulin detemir  20 Units Subcutaneous BID  . metoprolol succinate  25 mg Oral Daily  . metronidazole  500 mg Intravenous Once  . metronidazole  500 mg Intravenous Q8H  . venlafaxine XR  150 mg Oral Daily   Anti-infectives    Start     Dose/Rate Route Frequency Ordered Stop   02/09/15 1800  ciprofloxacin (CIPRO) IVPB 400 mg     400 mg 200 mL/hr over 60 Minutes Intravenous Every 12 hours 02/09/15 0803     02/09/15 1400  metroNIDAZOLE (FLAGYL) IVPB 500 mg     500 mg 100 mL/hr over 60 Minutes Intravenous Every 8 hours 02/09/15 0713     02/09/15 0500  ciprofloxacin (CIPRO) IVPB 400 mg     400 mg 200 mL/hr over 60 Minutes Intravenous  Once 02/09/15 0453 02/09/15 0615   02/09/15 0500  metroNIDAZOLE (FLAGYL) IVPB 500 mg     500 mg 100 mL/hr over 60 Minutes Intravenous  Once 02/09/15 0453       Assessment: 53 yoF with diarrhea, abd pain. CT: colitis from distal transverse colon through the descending colon. Lactic acid, WBC elevated, afebrile. Begin Ciprofloxacin with pharmacy dosing assistance, Metronidazole.  Goal of Therapy:  Antibiotic appropriate for treatment, dose and schedule appropriate for renal function  Plan:   Cipro  IV q12  Metronidazole  IV q8hr appropriate  Po antibiotics when symptoms improve  Thank you,  Otho Bellows PharmD Pager [phone] 02/09/2015, 8:08 AM

## 2015-02-09 NOTE — ED Notes (Signed)
Pt arrived to the ED with a complaint of left sided flank pain.  Pt states the pain is in the lower back and radiates to the lower left abdomen.  Pt has a hx of kidney stones and states pain is similar to that.

## 2015-02-09 NOTE — ED Provider Notes (Addendum)
This chart was scribed for Layla Maw Maryfer Tauzin, DO by Arlan Organ, ED Scribe. This patient was seen in room WA23/WA23 and the patient's care was started 3:14 AM.   TIME SEEN: 3:14 AM   CHIEF COMPLAINT:  Chief Complaint  Patient presents with  . Flank Pain     HPI:  HPI Comments: JEANITA CARNEIRO is a 54 y.o. female with a PMHx of IDDM, kidney stones, spinal stenosis status post 2 previous back surgeries who presents to the Emergency Department complaining of constant, ongoing, unchanged diffuse abdominal pain onset 7:00 PM this evening. She also reports diffuse abdominal pain and lower back pain, nausea, and ongoing diarrhea. No aggravating or alleviating factors at this time. No OTC medications or home remedies attempted prior to arrival. No recent fever, chills, chest pain, or shortness of breath. No vaginal bleeding or vaginal discharge. No dysuria or hematuria but urine has been "dark". PSHx includes nissen fundoplication, cholecystectomy, abdominal hysterectomy, and bladder suspension. Last kidney stone approximately 4 years ago.  Unable to tell me if this feel similar.  Denies any back injury. Denies recent back surgery or epidural injection. No numbness, tingling or focal weakness. No bowel or bladder incontinence. No urinary retention.  PCP: Montey Hora in [place]  ROS: See HPI Constitutional: no fever  Eyes: no drainage  ENT: no runny nose   Cardiovascular:  no chest pain  Resp: no SOB  GI: no vomiting GU: no dysuria Integumentary: no rash  Allergy: no hives  Musculoskeletal: no leg swelling  Neurological: no slurred speech ROS otherwise negative  PAST MEDICAL HISTORY/PAST SURGICAL HISTORY:  Past Medical History  Diagnosis Date  . Diabetes mellitus     x 12 yrs    MEDICATIONS:  Prior to Admission medications   Medication Sig Start Date End Date Taking? Authorizing Provider  furosemide (LASIX) 40 MG tablet Take 40 mg by mouth daily.      Historical Provider, MD   glipiZIDE (GLUCOTROL) 5 MG tablet Take 5 mg by mouth daily.      Historical Provider, MD  insulin aspart (NOVOLOG FLEXPEN) 100 UNIT/ML injection Inject 10 Units into the skin 2 (two) times daily.      Historical Provider, MD  insulin glargine (LANTUS) 100 UNIT/ML injection Inject 45 Units into the skin 2 (two) times daily.      Historical Provider, MD  metFORMIN (GLUCOPHAGE) 500 MG tablet Take 500 mg by mouth 2 (two) times daily with a meal.      Historical Provider, MD  Omega-3 Fatty Acids (FISH OIL PO) Take 1 capsule by mouth daily.      Historical Provider, MD  omeprazole (PRILOSEC) 40 MG capsule Take 40 mg by mouth 2 (two) times daily.      Historical Provider, MD  polyethylene glycol (MIRALAX / GLYCOLAX) packet Take 34 g by mouth daily.      Historical Provider, MD  potassium chloride SA (K-DUR,KLOR-CON) 20 MEQ tablet Take 20 mEq by mouth daily.      Historical Provider, MD  venlafaxine (EFFEXOR-XR) 150 MG 24 hr capsule Take 150 mg by mouth daily.      Historical Provider, MD  VITAMIN E PO Take 1 capsule by mouth daily.      Historical Provider, MD    ALLERGIES:  No Known Allergies  SOCIAL HISTORY:  Social History  Substance Use Topics  . Smoking status: Current Every Day Smoker -- 0.50 packs/day    Types: Cigarettes  . Smokeless tobacco: Not on file  .  Alcohol Use: No    FAMILY HISTORY: No family history on file.  EXAM: BP 157/88 mmHg  Pulse 103  Temp(Src) 98.5 F (36.9 C) (Oral)  Resp 20  SpO2 99% CONSTITUTIONAL: Alert and oriented and responds appropriately to questions. Obese, appears uncomfortable, moaning in pain HEAD: Normocephalic EYES: Conjunctivae clear, PERRL ENT: normal nose; no rhinorrhea; moist mucous membranes; pharynx without lesions noted NECK: Supple, no meningismus, no LAD  CARD: Regular and tachycardic; S1 and S2 appreciated; no murmurs, no clicks, no rubs, no gallops RESP: Normal chest excursion without splinting or tachypnea; breath sounds clear  and equal bilaterally; no wheezes, no rhonchi, no rales, no hypoxia or respiratory distress, speaking full sentences ABD/GI: Normal bowel sounds; non-distended; soft, diffusely tender to palpation with voluntary guarding, no rebound, no peritoneal signs BACK:  The back appears normal and is non-tender to palpation, there is no CVA tenderness, no midline spinal tenderness or step-off or deformity; no erythema or warmth noted to the back EXT: Normal ROM in all joints; non-tender to palpation; no edema; normal capillary refill; no cyanosis, no calf tenderness or swelling    SKIN: Normal color for age and race; warm NEURO: Moves all extremities equally, sensation to light touch intact diffusely, cranial nerves II through XII intact, no saddle anesthesia PSYCH: The patient's mood and manner are appropriate. Grooming and personal hygiene are appropriate.  MEDICAL DECISION MAKING: Patient here with diffuse abdominal pain, nausea and diarrhea. Patient moaning out in pain. She has hemodynamic is stable, afebrile. Differential diagnosis includes colitis, diverticulitis, pancreatitis, kidney stone. We'll obtain labs, urine and a CT of her abdomen and pelvis. We'll give IV fluids, pain and nausea medicine.  ED PROGRESS: Patient has a leukocytosis of 23,000 with left shift. Creatinine, LFTs and lipase normal. Urine shows ketones but no sign of infection. CT scan shows colitis extending from the distal transverse colon to the descending colon. Likely infectious in nature. Will give IV Cipro, IV Flagyl. Patient is still in a significant amount of pain despite morphine, Dilaudid. I feel she will need admission. Stool cultures pending.  5:05 AM  D/w Dr. Toniann Fail for admission to medical bed. We'll give second liter of IV fluids given patient's lactate is elevated at 2.74. Patient updated with plan.    I personally performed the services described in this documentation, which was scribed in my presence. The recorded  information has been reviewed and is accurate.   Layla Maw Rivky Clendenning, DO 02/09/15 0510   5:50 AM  Nursing staff states they were unable to obtain stool sample as patient had urine and stool mixed together but there appeared to be blood clots in her stool. Hemoglobin is 13.8. She is not on anticoagulation. We'll send type and screen. She is hemodynamically stable.  Layla Maw Andray Assefa, DO 02/09/15 [phone]

## 2015-02-09 NOTE — H&P (Signed)
[facility] History and Physical  [name]:[id number] DOB: 1960/10/05 DOA: 02/09/2015  Referring physician: Dr. Elesa Massed. PCP: Clydie Braun, MD  Specialists: None.  Chief Complaint: Diarrhea with abdominal pain.  HPI: [name] is a 54 y.o. female with history of diabetes mellitus type 2, hypertension, hyperlipidemia, ongoing tobacco abuse presents to the ER because of multiple episodes of diarrhea since last evening 7 PM. Patient also has benign persistent abdominal pain mostly in the epigastric area nonradiating with dry heaving. Patient states she cannot vomit because of her fundoplication. Denies any recent use of antibiotics hospitalization sick contacts or travels. In the ER CT abdomen and pelvis shows colitis from the distal transverse colon through the descending colon. Patient's lactic acid is mildly elevated. Patient states she also noticed some blood in the stools. Denies having eaten any raw meat or underdone beef/burger or hot dog.   Review of Systems: As presented in the history of presenting illness, rest negative.  Past Medical History  Diagnosis Date  . Diabetes mellitus     x 12 yrs   Past Surgical History  Procedure Laterality Date  . Back surgery  9629,5284  . Carpal tunnel release    . Nissen fundoplication    . Hip arthroscopy w/ labral debridement      left  . Cholecystectomy    . Abdominal hysterectomy    . Bladder suspension    . Portacath placement    . Port-a-cath removal    . Anterior cervical decomp/discectomy fusion  07/02/2011    Procedure: ANTERIOR CERVICAL DECOMPRESSION/DISCECTOMY FUSION 1 LEVEL;  Surgeon: Cristi Loron, MD;  Location: [facility];  Service: Neurosurgery;  Laterality: Bilateral;  Cervical six-seven anterior cervical decompression  fusion with interbody prosthesis and plating    Social History:  reports that she has been smoking Cigarettes.  She has been smoking about 0.50 packs per day. She does not have any  smokeless tobacco history on file. She reports that she does not drink alcohol or use illicit drugs. Where does patient live home. Can patient participate in ADLs? Yes.  No Known Allergies  Family History:  Family History  Problem Relation Age of Onset  . Diabetes Mellitus II Mother   . Diabetes Mellitus II Father   . Hypertension Father       Prior to Admission medications   Medication Sig Start Date End Date Taking? Authorizing Provider  atorvastatin (LIPITOR) 80 MG tablet Take 80 mg by mouth daily.   Yes Historical Provider, MD  furosemide (LASIX) 40 MG tablet Take 40 mg by mouth daily.     Yes Historical Provider, MD  gabapentin (NEURONTIN) 100 MG capsule Take 100 mg by mouth at bedtime.    Yes Historical Provider, MD  glipiZIDE (GLUCOTROL) 5 MG tablet Take 5 mg by mouth daily.     Yes Historical Provider, MD  insulin aspart (NOVOLOG FLEXPEN) 100 UNIT/ML injection Inject 10 Units into the skin 2 (two) times daily.     Yes Historical Provider, MD  insulin detemir (LEVEMIR) 100 UNIT/ML injection Inject 45 Units into the skin 2 (two) times daily.   Yes Historical Provider, MD  lisinopril (PRINIVIL,ZESTRIL) 10 MG tablet Take 1 tablet by mouth daily. 12/20/12  Yes Historical Provider, MD  meloxicam (MOBIC) 15 MG tablet Take 15 mg by mouth daily.   Yes Historical Provider, MD  metFORMIN (GLUCOPHAGE) 500 MG tablet Take 500 mg by mouth 2 (two) times daily with a meal.  Yes Historical Provider, MD  metoprolol succinate (TOPROL-XL) 25 MG 24 hr tablet Take 25 mg by mouth daily.   Yes Historical Provider, MD  omeprazole (PRILOSEC) 40 MG capsule Take 40 mg by mouth 2 (two) times daily.     Yes Historical Provider, MD  OxyCODONE (OXYCONTIN) 10 mg T12A 12 hr tablet Take 10 mg by mouth every 4 (four) hours as needed.   Yes Historical Provider, MD  polyethylene glycol (MIRALAX / GLYCOLAX) packet Take 34 g by mouth daily.     Yes Historical Provider, MD  venlafaxine (EFFEXOR-XR) 150 MG 24 hr capsule  Take 150 mg by mouth daily.     Yes Historical Provider, MD  VITAMIN E PO Take 1 capsule by mouth daily.     Yes Historical Provider, MD    Physical Exam: Filed Vitals:   02/09/15 0309 02/09/15 0310 02/09/15 0547 02/09/15 0659  BP: 157/88 157/88    Pulse: 103 103 83   Temp: 98.5 F (36.9 C) 98.5 F (36.9 C) 98.5 F (36.9 C)   TempSrc: Oral Oral Oral   Resp: 20 20 18    Height:    5\' 6"  (1.676 m)  Weight:    95.709 kg (211 lb)  SpO2: 99% 98% 98%      General:  Moderately built and nourished.  Eyes: Anicteric. No pallor.  ENT: No discharge from the ears eyes nose and mouth.  Neck: No mass felt.  Cardiovascular: S1 and S2 heard.  Respiratory: No rhonchi or crepitations.  Abdomen: Epigastric tenderness no guarding no rigidity. Bowel sounds present.  Skin: No rash.  Musculoskeletal: No edema.  Psychiatric: Appears normal.  Neurologic: Alert awake oriented to time place and person. Moves all extremities.  Labs on Admission:  Basic Metabolic Panel:  Recent Labs Lab 02/09/15 0330  NA 137  K 4.0  CL 103  CO2 25  GLUCOSE 390*  BUN 19  CREATININE 0.70  CALCIUM 9.1   Liver Function Tests:  Recent Labs Lab 02/09/15 0330  AST 32  ALT 38  ALKPHOS 99  BILITOT 0.8  PROT 7.4  ALBUMIN 4.2    Recent Labs Lab 02/09/15 0330  LIPASE 17*   No results for input(s): AMMONIA in the last 168 hours. CBC:  Recent Labs Lab 02/09/15 0330  WBC 23.3*  NEUTROABS 20.7*  HGB 13.8  HCT 41.7  MCV 82.7  PLT 275   Cardiac Enzymes: No results for input(s): CKTOTAL, CKMB, CKMBINDEX, TROPONINI in the last 168 hours.  BNP (last 3 results) No results for input(s): BNP in the last 8760 hours.  ProBNP (last 3 results) No results for input(s): PROBNP in the last 8760 hours.  CBG: No results for input(s): GLUCAP in the last 168 hours.  Radiological Exams on Admission: Ct Abdomen Pelvis W Contrast  02/09/2015   CLINICAL DATA:  Abdominal and left flank pain for 8  hours. Nausea and diarrhea.  EXAM: CT ABDOMEN AND PELVIS WITH CONTRAST  TECHNIQUE: Multidetector CT imaging of the abdomen and pelvis was performed using the standard protocol following bolus administration of intravenous contrast.  CONTRAST:  OMNIPAQUE IOHEXOL 300 MG/ML  SOLN  COMPARISON:  None.  FINDINGS: The lung bases are clear. Heart at the upper limits of normal in size.  Wall thickening with surrounding fat stranding of the colon spanning the distal transverse through distal descending. Findings are most consistent with colitis. No pneumatosis, perforation or abscess. Small volume of stool in the proximal colon. No small bowel dilatation or wall  thickening. The appendix is normal. The stomach is distended with ingested contents. Found to palpation change of the gastroesophageal junction. No free air, free fluid, or intra-abdominal fluid collection. No mesenteric or portal venous gas.  Clips in the gallbladder fossa from cholecystectomy. Additional surgical clips in the upper abdomen adjacent the left lobe of the liver. There is no focal hepatic lesion. Cleft is seen in the lateral spleen, no focal splenic lesion. The adrenal glands are normal. Pancreas appears truncated, no ductal dilatation or inflammation proximally. Kidneys demonstrate symmetric enhancement and excretion. No hydronephrosis or focal renal abnormality.  No retroperitoneal adenopathy. Abdominal aorta is normal in caliber. Minimal atherosclerosis without aneurysm.  Within the pelvis the uterus is surgically absent. No adnexal mass. Ovaries are symmetric in size and quiescent. Bladder is physiologically distended without wall thickening. There is pelvic floor descent.  Posterior fusion L4 through S1. Mild degenerative change in the upper lumbar spine. There are no acute or suspicious osseous abnormalities.  IMPRESSION: Colitis extending from the distal transverse through the descending colon. This may be infectious or inflammatory.    Electronically Signed   By: Rubye Oaks M.D.   On: 02/09/2015 04:45     Assessment/Plan Principal Problem:   Colitis Active Problems:   Diabetes mellitus type 2, uncontrolled   Essential hypertension   Hyperlipidemia   1. Colitis - differentials include infectious versus ischemic versus inflammatory. Since patient's lactic acid is elevated also only certain segments of the colon is involved ischemia should be under consideration. Continue with aggressive hydration. Patient has received 2 L normal saline bolus and I'm continuing with normal saline infusion. I have ordered stool studies including for C. difficile and Escherichia coli and Campylobacter (see orders). Recheck lactic acid levels. Patient has been empirically placed on Cipro and Flagyl. May need GI consult if pain persists. I have placed patient on clear liquid diet. 2. Diabetes mellitus2 uncontrolled - patient states she did take her Levemir last night. Since patient is going be on clear liquids that have decreased in Levemir dose from 45 units to 20 units. Closely follow metabolic panel to make sure patient is not going to DKA. Since patient's lactic acid is elevated I'm holding off patient's metformin. Sliding scale coverage. 3. Hypertension - holding of Lasix and lisinopril but will continue with metoprolol and place patient on when necessary IV hydralazine. 4. Hyperlipidemia on statins. 5. Tobacco abuse - tobacco cessation counseling requested.   DVT Prophylaxis SCDs as patient has some blood in the stools.  Code Status: Full code.  Family Communication: Discussed with patient.  Disposition Plan: Admit to inpatient.    Daouda Lonzo N. [facility] Pager [phone].  If 7PM-7AM, please contact night-coverage [email or web address] Password [email or web address] 02/09/2015, 7:11 AM

## 2015-02-09 NOTE — ED Notes (Signed)
Notified EDP, Ward,MD., pt. i-stat lactic acid 2.74.

## 2015-02-09 NOTE — Progress Notes (Signed)
Progress Note   [name] DOB: Feb 24, 1961 DOA: 02/09/2015 PCP: Clydie Braun, MD   Brief Narrative:   [name] is an 54 y.o. female the PMH of type 2 diabetes, hypertension, hyperlipidemia and ongoing tobacco abuse who was admitted 02/09/15 with a chief complaint of intractable diarrhea and persistent abdominal pain with dry heaving. No recent antibiotics use. CT of the abdomen and pelvis done in the ED showed colitis with mild lactic acidosis.  Assessment/Plan:   Principal Problem:   Colitis - CT scan shows colitis extending from the distal transverse through the descending colon. - Follow-up GI pathogen panel, stool culture and C. difficile studies. - Continue empiric Cipro/Flagyl. - Continue bowel rest with clear liquid diet.  Active Problems:   Tobacco abuse - Cessation counseling requested.    Diabetes mellitus type 2, uncontrolled with diabetic neuropathy - Home dose of Levemir decreased from 45 units to 20 units given bowel rest. Metformin on hold. - Continue Neurontin for neuropathy. - Also on Glucotrol 5 mg daily and insulin sensitive SSI 3 times a day ordered. - Given elevated blood glucose is, increase Levemir to 30 units and change SSI to resistant scale.    Essential hypertension - Lasix and lisinopril on hold. Continue metoprolol and when necessary hydralazine.    Hyperlipidemia - Continue Lipitor.    DVT Prophylaxis - SCDs ordered.  Family Communication: Sister updated at the bedside. Disposition Plan: Home when stable, likely several days. Code Status:     Code Status Orders        Start     Ordered   02/09/15 0710  Full code   Continuous     02/09/15 0710        IV Access:    Peripheral IV   Procedures and diagnostic studies:   Ct Abdomen Pelvis W Contrast  02/09/2015   CLINICAL DATA:  Abdominal and left flank pain for 8 hours. Nausea and diarrhea.  EXAM: CT ABDOMEN AND PELVIS WITH CONTRAST  TECHNIQUE:  Multidetector CT imaging of the abdomen and pelvis was performed using the standard protocol following bolus administration of intravenous contrast.  CONTRAST:  OMNIPAQUE IOHEXOL 300 MG/ML  SOLN  COMPARISON:  None.  FINDINGS: The lung bases are clear. Heart at the upper limits of normal in size.  Wall thickening with surrounding fat stranding of the colon spanning the distal transverse through distal descending. Findings are most consistent with colitis. No pneumatosis, perforation or abscess. Small volume of stool in the proximal colon. No small bowel dilatation or wall thickening. The appendix is normal. The stomach is distended with ingested contents. Found to palpation change of the gastroesophageal junction. No free air, free fluid, or intra-abdominal fluid collection. No mesenteric or portal venous gas.  Clips in the gallbladder fossa from cholecystectomy. Additional surgical clips in the upper abdomen adjacent the left lobe of the liver. There is no focal hepatic lesion. Cleft is seen in the lateral spleen, no focal splenic lesion. The adrenal glands are normal. Pancreas appears truncated, no ductal dilatation or inflammation proximally. Kidneys demonstrate symmetric enhancement and excretion. No hydronephrosis or focal renal abnormality.  No retroperitoneal adenopathy. Abdominal aorta is normal in caliber. Minimal atherosclerosis without aneurysm.  Within the pelvis the uterus is surgically absent. No adnexal mass. Ovaries are symmetric in size and quiescent. Bladder is physiologically distended without wall thickening. There is pelvic floor descent.  Posterior fusion L4 through S1. Mild degenerative change in the upper lumbar spine.  There are no acute or suspicious osseous abnormalities.  IMPRESSION: Colitis extending from the distal transverse through the descending colon. This may be infectious or inflammatory.   Electronically Signed   By: Rubye Oaks M.D.   On: 02/09/2015 04:45      Medical Consultants:    None.  Anti-Infectives:    Cipro 02/09/15--->  Flagyl 02/09/15--->  Subjective:    [name] continues to have significant abdominal pain and nausea (says she is unable to vomit due to history of fundoplication.  Objective:    Filed Vitals:   02/09/15 0547 02/09/15 0659 02/09/15 0835 02/09/15 0928  BP:   149/79 162/80  Pulse: 83  99 95  Temp: 98.5 F (36.9 C)  98.6 F (37 C) 98 F (36.7 C)  TempSrc: Oral  Oral Oral  Resp: 18  18 18   Height:  5\' 6"  (1.676 m)    Weight:  95.709 kg (211 lb)    SpO2: 98%  93% 95%    Intake/Output Summary (Last 24 hours) at 02/09/15 1429 Last data filed at 02/09/15 1400  Gross per 24 hour  Intake    975 ml  Output   1300 ml  Net   -325 ml    Exam: Gen:  NAD Cardiovascular:  RRR, No M/R/G Respiratory:  Lungs CTAB Gastrointestinal:  Abdomen softly distended and tender throughout, + BS Extremities:  No C/E/C   Data Reviewed:    Labs: Basic Metabolic Panel:  Recent Labs Lab 02/09/15 0330 02/09/15 0830  NA 137 137  K 4.0 4.2  CL 103 106  CO2 25 24  GLUCOSE 390* 393*  BUN 19 16  CREATININE 0.70 0.73  CALCIUM 9.1 8.5*   GFR Estimated Creatinine Clearance: 96 mL/min (by C-G formula based on Cr of 0.73). Liver Function Tests:  Recent Labs Lab 02/09/15 0330 02/09/15 0830  AST 32 25  ALT 38 33  ALKPHOS 99 88  BILITOT 0.8 0.3  PROT 7.4 6.5  ALBUMIN 4.2 3.5    Recent Labs Lab 02/09/15 0330  LIPASE 17*   CBC:  Recent Labs Lab 02/09/15 0330 02/09/15 0830  WBC 23.3* 19.7*  NEUTROABS 20.7* 18.0*  HGB 13.8 12.3  HCT 41.7 37.1  MCV 82.7 82.8  PLT 275 276   CBG:  Recent Labs Lab 02/09/15 1228  GLUCAP 313*   Sepsis Labs:  Recent Labs Lab 02/09/15 0330 02/09/15 0501 02/09/15 0830  WBC 23.3*  --  19.7*  LATICACIDVEN  --  2.74* 1.0   Microbiology No results found for this or any previous visit (from the past 240 hour(s)).   Medications:   . atorvastatin   80 mg Oral Daily  . ciprofloxacin  400 mg Intravenous Q12H  . gabapentin  100 mg Oral QHS  . glipiZIDE  5 mg Oral QAC breakfast  . insulin aspart  0-9 Units Subcutaneous TID WC  . insulin detemir  20 Units Subcutaneous BID  . metoprolol succinate  25 mg Oral Daily  . metronidazole  500 mg Intravenous Q8H  . venlafaxine XR  150 mg Oral Daily   Continuous Infusions: . sodium chloride 125 mL/hr (02/09/15 1000)    Time spent: 25 minutes.   LOS: 0 days   RAMA,CHRISTINA  [facility] Pager [phone]. If unable to reach me by pager, please call my cell phone at [phone].  *Please refer to [email or web address], password [email or web address] to get updated schedule on who will round on this patient, as hospitalists switch teams weekly. If 7PM-7AM, please  contact night-coverage at [email or web address], password [email or web address] for any overnight needs.  02/09/2015, 2:29 PM

## 2015-02-09 NOTE — ED Notes (Signed)
Pt to CT

## 2015-02-09 NOTE — ED Notes (Signed)
Bed: WA23 Expected date:  Expected time:  Means of arrival:  Comments: EMS 

## 2015-02-10 LAB — GLUCOSE, CAPILLARY
GLUCOSE-CAPILLARY: 139 mg/dL — AB (ref 65–99)
GLUCOSE-CAPILLARY: 170 mg/dL — AB (ref 65–99)
Glucose-Capillary: 150 mg/dL — ABNORMAL HIGH (ref 65–99)
Glucose-Capillary: 201 mg/dL — ABNORMAL HIGH (ref 65–99)

## 2015-02-10 MED ORDER — HYDROMORPHONE HCL 1 MG/ML IJ SOLN
1.0000 mg | INTRAMUSCULAR | Status: DC | PRN
  Administered 2015-02-10 – 2015-02-11 (×16): 1 mg via INTRAVENOUS
  Filled 2015-02-10 (×16): qty 1

## 2015-02-10 NOTE — Progress Notes (Signed)
Patient passing thick dark red blood from rectum,and   and reports abd pain becoming more severe and Dilaudid 0.5mg  not relieving pain .[name] notified and orders received.

## 2015-02-10 NOTE — Progress Notes (Signed)
Progress Note   [name] EPP:[id number] DOB: 07-26-1960 DOA: 02/09/2015 PCP: Clydie Braun, MD   Brief Narrative:   [name] is an 54 y.o. female the PMH of type 2 diabetes, hypertension, hyperlipidemia and ongoing tobacco abuse who was admitted 02/09/15 with a chief complaint of intractable diarrhea and persistent abdominal pain with dry heaving. No recent antibiotics use. CT of the abdomen and pelvis done in the ED showed colitis with mild lactic acidosis.  Assessment/Plan:   Principal Problem:   Colitis - CT scan shows colitis extending from the distal transverse through the descending colon. - No bowel movements since admission, discontinue stool studies and enteric precautions. - Continue empiric Cipro/Flagyl. - Advance diet.  Active Problems:   Tobacco abuse - Cessation counseling requested.    Diabetes mellitus type 2, uncontrolled with diabetic neuropathy - Metformin on hold. - Continue Neurontin for neuropathy. - Continue Glucotrol 5 mg daily, Levemir 30 units daily and SSI, resistant scale. CBGs 125-159 after insulin adjusted yesterday.    Essential hypertension - Lasix and lisinopril on hold. Continue metoprolol and when necessary hydralazine.    Hyperlipidemia - Continue Lipitor.    DVT Prophylaxis - SCDs ordered.  Family Communication: No family at the bedside today. Disposition Plan: Home when stable and pain controlled, likely 1-2 more days. Code Status:     Code Status Orders        Start     Ordered   02/09/15 0710  Full code   Continuous     02/09/15 0710        IV Access:    Peripheral IV   Procedures and diagnostic studies:   Ct Abdomen Pelvis W Contrast  02/09/2015   CLINICAL DATA:  Abdominal and left flank pain for 8 hours. Nausea and diarrhea.  EXAM: CT ABDOMEN AND PELVIS WITH CONTRAST  TECHNIQUE: Multidetector CT imaging of the abdomen and pelvis was performed using the standard protocol following bolus  administration of intravenous contrast.  CONTRAST:  OMNIPAQUE IOHEXOL 300 MG/ML  SOLN  COMPARISON:  None.  FINDINGS: The lung bases are clear. Heart at the upper limits of normal in size.  Wall thickening with surrounding fat stranding of the colon spanning the distal transverse through distal descending. Findings are most consistent with colitis. No pneumatosis, perforation or abscess. Small volume of stool in the proximal colon. No small bowel dilatation or wall thickening. The appendix is normal. The stomach is distended with ingested contents. Found to palpation change of the gastroesophageal junction. No free air, free fluid, or intra-abdominal fluid collection. No mesenteric or portal venous gas.  Clips in the gallbladder fossa from cholecystectomy. Additional surgical clips in the upper abdomen adjacent the left lobe of the liver. There is no focal hepatic lesion. Cleft is seen in the lateral spleen, no focal splenic lesion. The adrenal glands are normal. Pancreas appears truncated, no ductal dilatation or inflammation proximally. Kidneys demonstrate symmetric enhancement and excretion. No hydronephrosis or focal renal abnormality.  No retroperitoneal adenopathy. Abdominal aorta is normal in caliber. Minimal atherosclerosis without aneurysm.  Within the pelvis the uterus is surgically absent. No adnexal mass. Ovaries are symmetric in size and quiescent. Bladder is physiologically distended without wall thickening. There is pelvic floor descent.  Posterior fusion L4 through S1. Mild degenerative change in the upper lumbar spine. There are no acute or suspicious osseous abnormalities.  IMPRESSION: Colitis extending from the distal transverse through the descending colon. This may be infectious or inflammatory.  Electronically Signed   By: Rubye Oaks M.D.   On: 02/09/2015 04:45     Medical Consultants:    None.  Anti-Infectives:    Cipro 02/09/15--->  Flagyl 02/09/15--->  Subjective:    [name] continues to have crampy abdominal pain but no bowel movements and no vomiting. Nausea improved.  Objective:    Filed Vitals:   02/09/15 0835 02/09/15 0928 02/09/15 2154 02/10/15 0642  BP: 149/79 162/80 164/85 117/91  Pulse: 99 95 93 94  Temp: 98.6 F (37 C) 98 F (36.7 C) 99 F (37.2 C) 98.4 F (36.9 C)  TempSrc: Oral Oral Oral Oral  Resp: 18 18 18 18   Height:      Weight:    97.6 kg (215 lb 2.7 oz)  SpO2: 93% 95% 98% 95%    Intake/Output Summary (Last 24 hours) at 02/10/15 0908 Last data filed at 02/10/15 0600  Gross per 24 hour  Intake   3215 ml  Output    700 ml  Net   2515 ml    Exam: Gen:  NAD Cardiovascular:  RRR, No M/R/G Respiratory:  Lungs CTAB Gastrointestinal:  Abdomen softly distended and tender throughout, + BS Extremities:  No C/E/C   Data Reviewed:    Labs: Basic Metabolic Panel:  Recent Labs Lab 02/09/15 0330 02/09/15 0830  NA 137 137  K 4.0 4.2  CL 103 106  CO2 25 24  GLUCOSE 390* 393*  BUN 19 16  CREATININE 0.70 0.73  CALCIUM 9.1 8.5*   GFR Estimated Creatinine Clearance: 96.9 mL/min (by C-G formula based on Cr of 0.73). Liver Function Tests:  Recent Labs Lab 02/09/15 0330 02/09/15 0830  AST 32 25  ALT 38 33  ALKPHOS 99 88  BILITOT 0.8 0.3  PROT 7.4 6.5  ALBUMIN 4.2 3.5    Recent Labs Lab 02/09/15 0330  LIPASE 17*   CBC:  Recent Labs Lab 02/09/15 0330 02/09/15 0830  WBC 23.3* 19.7*  NEUTROABS 20.7* 18.0*  HGB 13.8 12.3  HCT 41.7 37.1  MCV 82.7 82.8  PLT 275 276   CBG:  Recent Labs Lab 02/09/15 1228 02/09/15 1722 02/09/15 2151  GLUCAP 313* 125* 159*   Sepsis Labs:  Recent Labs Lab 02/09/15 0330 02/09/15 0501 02/09/15 0830  WBC 23.3*  --  19.7*  LATICACIDVEN  --  2.74* 1.0   Microbiology No results found for this or any previous visit (from the past 240 hour(s)).   Medications:   . atorvastatin  80 mg Oral Daily  . ciprofloxacin  400 mg Intravenous Q12H  . gabapentin   100 mg Oral QHS  . glipiZIDE  5 mg Oral QAC breakfast  . insulin aspart  0-20 Units Subcutaneous TID WC  . insulin aspart  0-5 Units Subcutaneous QHS  . insulin detemir  30 Units Subcutaneous BID  . metoprolol succinate  25 mg Oral Daily  . metronidazole  500 mg Intravenous Q8H  . venlafaxine XR  150 mg Oral Daily   Continuous Infusions:    Time spent: 25 minutes.   LOS: 1 day   RAMA,CHRISTINA  [facility] Pager [phone]. If unable to reach me by pager, please call my cell phone at [phone].  *Please refer to [email or web address], password [email or web address] to get updated schedule on who will round on this patient, as hospitalists switch teams weekly. If 7PM-7AM, please contact night-coverage at [email or web address], password [email or web address] for any overnight needs.  02/10/2015, 9:08 AM

## 2015-02-11 DIAGNOSIS — K625 Hemorrhage of anus and rectum: Secondary | ICD-10-CM

## 2015-02-11 LAB — CBC
HEMATOCRIT: 34.4 % — AB (ref 36.0–46.0)
Hemoglobin: 11 g/dL — ABNORMAL LOW (ref 12.0–15.0)
MCH: 27.2 pg (ref 26.0–34.0)
MCHC: 32 g/dL (ref 30.0–36.0)
MCV: 84.9 fL (ref 78.0–100.0)
Platelets: 259 10*3/uL (ref 150–400)
RBC: 4.05 MIL/uL (ref 3.87–5.11)
RDW: 12.8 % (ref 11.5–15.5)
WBC: 12.8 10*3/uL — ABNORMAL HIGH (ref 4.0–10.5)

## 2015-02-11 LAB — GLUCOSE, CAPILLARY: Glucose-Capillary: 124 mg/dL — ABNORMAL HIGH (ref 65–99)

## 2015-02-11 MED ORDER — SENNA 8.6 MG PO TABS: 1.0000 | ORAL_TABLET | Freq: Every day | ORAL | Status: DC

## 2015-02-11 MED ORDER — METRONIDAZOLE 500 MG PO TABS: 500.0000 mg | ORAL_TABLET | Freq: Three times a day (TID) | ORAL | Status: DC

## 2015-02-11 MED ORDER — OXYCODONE-ACETAMINOPHEN 10-325 MG PO TABS: 1.0000 | ORAL_TABLET | ORAL | Status: DC | PRN

## 2015-02-11 MED ORDER — CIPROFLOXACIN HCL 500 MG PO TABS: 500.0000 mg | ORAL_TABLET | Freq: Two times a day (BID) | ORAL | Status: DC

## 2015-02-11 MED ORDER — ONDANSETRON HCL 4 MG PO TABS: 4.0000 mg | ORAL_TABLET | Freq: Four times a day (QID) | ORAL | Status: DC | PRN

## 2015-02-11 MED ORDER — HYOSCYAMINE SULFATE 0.125 MG SL SUBL: 0.2500 mg | SUBLINGUAL_TABLET | SUBLINGUAL | Status: DC | PRN

## 2015-02-11 NOTE — Discharge Instructions (Signed)

## 2015-02-11 NOTE — Discharge Summary (Addendum)
Physician Discharge Summary  [name]:[id number] DOB: 05-Jul-1960 DOA: 02/09/2015  PCP: Clydie Braun, MD  Admit date: 02/09/2015 Discharge date: 02/11/2015   Recommendations for Outpatient Follow-Up:   1. Recommend close F/U with PCP to ensure resolution of symptoms, re-check hemoglobin, consider referral to GI.   Discharge Diagnosis:   Principal Problem:    Colitis Active Problems:    Diabetes mellitus type 2, uncontrolled    Essential hypertension    Hyperlipidemia    Rectal bleeding    Chronic constipation   Discharge disposition:  Home.    Discharge Condition: Improved.  Diet recommendation: Low sodium, heart healthy.    History of Present Illness:   [name] is an 54 y.o. female the PMH of type 2 diabetes, hypertension, hyperlipidemia and ongoing tobacco abuse who was admitted 02/09/15 with a chief complaint of intractable diarrhea and persistent abdominal pain with dry heaving. No recent antibiotics use. CT of the abdomen and pelvis done in the ED showed colitis with mild lactic acidosis.   Hospital Course by Problem:   *Principal Problem:  Colitis with rectal bleeding - CT scan showed colitis extending from the distal transverse through the descending colon. - No bowel movements since admission, so stool studies could not be performed. - Continue empiric Cipro/Flagyl, discharge home on an additional 10 days of therapy. - Tolerated diet advancement and felt well enough for D/C home 02/11/15. - Minimal rectal bleeding, thought to be from hemorrhoids.  Active Problems:   Chronic constipation - Continue daily MiraLAX, add Sennokot Q HS.   Tobacco abuse - Cessation counseling requested.   Diabetes mellitus type 2, uncontrolled with diabetic neuropathy - Home regimen resumed at discharge. - Continue Neurontin for neuropathy.   Essential hypertension - Home regimen resumed at discharge.   Hyperlipidemia - Continue  Lipitor.    Medical Consultants:    None.   Discharge Exam:   Filed Vitals:   02/11/15 0516  BP: 163/73  Pulse: 74  Temp: 98.1 F (36.7 C)  Resp: 18   Filed Vitals:   02/10/15 0642 02/10/15 1356 02/10/15 2117 02/11/15 0516  BP: 117/91 162/82 118/52 163/73  Pulse: 94 90 98 74  Temp: 98.4 F (36.9 C) 99.1 F (37.3 C) 98.5 F (36.9 C) 98.1 F (36.7 C)  TempSrc: Oral Oral Oral Oral  Resp: 18 18 16 18   Height:      Weight: 97.6 kg (215 lb 2.7 oz)     SpO2: 95% 95% 97% 97%    Gen:  NAD Cardiovascular:  RRR, No M/R/G Respiratory: Lungs CTAB Gastrointestinal: Abdomen soft, decreased tenderness with normal active bowel sounds. Extremities: No C/E/C   The results of significant diagnostics from this hospitalization (including imaging, microbiology, ancillary and laboratory) are listed below for reference.     Procedures and Diagnostic Studies:   Ct Abdomen Pelvis W Contrast  02/09/2015   CLINICAL DATA:  Abdominal and left flank pain for 8 hours. Nausea and diarrhea.  EXAM: CT ABDOMEN AND PELVIS WITH CONTRAST  TECHNIQUE: Multidetector CT imaging of the abdomen and pelvis was performed using the standard protocol following bolus administration of intravenous contrast.  CONTRAST:  OMNIPAQUE IOHEXOL 300 MG/ML  SOLN  COMPARISON:  None.  FINDINGS: The lung bases are clear. Heart at the upper limits of normal in size.  Wall thickening with surrounding fat stranding of the colon spanning the distal transverse through distal descending. Findings are most consistent with colitis. No pneumatosis, perforation or abscess. Small volume  of stool in the proximal colon. No small bowel dilatation or wall thickening. The appendix is normal. The stomach is distended with ingested contents. Found to palpation change of the gastroesophageal junction. No free air, free fluid, or intra-abdominal fluid collection. No mesenteric or portal venous gas.  Clips in the gallbladder fossa from  cholecystectomy. Additional surgical clips in the upper abdomen adjacent the left lobe of the liver. There is no focal hepatic lesion. Cleft is seen in the lateral spleen, no focal splenic lesion. The adrenal glands are normal. Pancreas appears truncated, no ductal dilatation or inflammation proximally. Kidneys demonstrate symmetric enhancement and excretion. No hydronephrosis or focal renal abnormality.  No retroperitoneal adenopathy. Abdominal aorta is normal in caliber. Minimal atherosclerosis without aneurysm.  Within the pelvis the uterus is surgically absent. No adnexal mass. Ovaries are symmetric in size and quiescent. Bladder is physiologically distended without wall thickening. There is pelvic floor descent.  Posterior fusion L4 through S1. Mild degenerative change in the upper lumbar spine. There are no acute or suspicious osseous abnormalities.  IMPRESSION: Colitis extending from the distal transverse through the descending colon. This may be infectious or inflammatory.   Electronically Signed   By: Rubye Oaks M.D.   On: 02/09/2015 04:45     Labs:   Basic Metabolic Panel:  Recent Labs Lab 02/09/15 0330 02/09/15 0830  NA 137 137  K 4.0 4.2  CL 103 106  CO2 25 24  GLUCOSE 390* 393*  BUN 19 16  CREATININE 0.70 0.73  CALCIUM 9.1 8.5*   GFR Estimated Creatinine Clearance: 96.9 mL/min (by C-G formula based on Cr of 0.73). Liver Function Tests:  Recent Labs Lab 02/09/15 0330 02/09/15 0830  AST 32 25  ALT 38 33  ALKPHOS 99 88  BILITOT 0.8 0.3  PROT 7.4 6.5  ALBUMIN 4.2 3.5    Recent Labs Lab 02/09/15 0330  LIPASE 17*    CBC:  Recent Labs Lab 02/09/15 0330 02/09/15 0830 02/11/15 0535  WBC 23.3* 19.7* 12.8*  NEUTROABS 20.7* 18.0*  --   HGB 13.8 12.3 11.0*  HCT 41.7 37.1 34.4*  MCV 82.7 82.8 84.9  PLT 275 276 259   CBG:  Recent Labs Lab 02/10/15 0742 02/10/15 1216 02/10/15 1717 02/10/15 2119 02/11/15 0820  GLUCAP 150* 139* 201* 170* 124*      Discharge Instructions:   Discharge Instructions    Call MD for:  persistant nausea and vomiting    Complete by:  As directed      Call MD for:  severe uncontrolled pain    Complete by:  As directed      Call MD for:  temperature >100.4    Complete by:  As directed      Diet - low sodium heart healthy    Complete by:  As directed      Diet Carb Modified    Complete by:  As directed      Increase activity slowly    Complete by:  As directed             Medication List    TAKE these medications        atorvastatin 80 MG tablet  Commonly known as:  LIPITOR  Take 80 mg by mouth daily.     ciprofloxacin 500 MG tablet  Commonly known as:  CIPRO  Take 1 tablet (500 mg total) by mouth 2 (two) times daily.     furosemide 40 MG tablet  Commonly known as:  LASIX  Take 40 mg by mouth daily.     gabapentin 100 MG capsule  Commonly known as:  NEURONTIN  Take 100 mg by mouth at bedtime.     glipiZIDE 5 MG tablet  Commonly known as:  GLUCOTROL  Take 5 mg by mouth daily.     hyoscyamine 0.125 MG SL tablet  Commonly known as:  LEVSIN SL  Place 2 tablets (0.25 mg total) under the tongue every 4 (four) hours as needed for cramping.     insulin detemir 100 UNIT/ML injection  Commonly known as:  LEVEMIR  Inject 45 Units into the skin 2 (two) times daily.     lisinopril 10 MG tablet  Commonly known as:  PRINIVIL,ZESTRIL  Take 1 tablet by mouth daily.     meloxicam 15 MG tablet  Commonly known as:  MOBIC  Take 15 mg by mouth daily.     metFORMIN 500 MG tablet  Commonly known as:  GLUCOPHAGE  Take 500 mg by mouth 2 (two) times daily with a meal.     metoprolol succinate 25 MG 24 hr tablet  Commonly known as:  TOPROL-XL  Take 25 mg by mouth daily.     metroNIDAZOLE 500 MG tablet  Commonly known as:  FLAGYL  Take 1 tablet (500 mg total) by mouth 3 (three) times daily.     NOVOLOG FLEXPEN 100 UNIT/ML injection  Generic drug:  insulin aspart  Inject 10 Units into  the skin 2 (two) times daily.     omeprazole 40 MG capsule  Commonly known as:  PRILOSEC  Take 40 mg by mouth 2 (two) times daily.     ondansetron 4 MG tablet  Commonly known as:  ZOFRAN  Take 1 tablet (4 mg total) by mouth every 6 (six) hours as needed for nausea.     oxyCODONE-acetaminophen 10-325 MG per tablet  Commonly known as:  PERCOCET  Take 1 tablet by mouth every 4 (four) hours as needed for pain.     OXYCONTIN 10 mg T12a 12 hr tablet  Generic drug:  OxyCODONE  Take 10 mg by mouth every 4 (four) hours as needed.     polyethylene glycol packet  Commonly known as:  MIRALAX / GLYCOLAX  Take 34 g by mouth daily.     senna 8.6 MG Tabs tablet  Commonly known as:  SENOKOT  Take 1 tablet (8.6 mg total) by mouth at bedtime.     venlafaxine XR 150 MG 24 hr capsule  Commonly known as:  EFFEXOR-XR  Take 150 mg by mouth daily.     VITAMIN E PO  Take 1 capsule by mouth daily.           Follow-up Information    Follow up with [facility], DAVID, MD. Schedule an appointment as soon as possible for a visit in 1 week.   Specialty:  Infectious Diseases   Why:  Hospital follow up colitis.   Contact information:   [address] [place] [address] [phone]        Time coordinating discharge: 25 minutes.  Signed:  Michelangelo Rindfleisch  Pager [phone] [facility] 02/11/2015, 11:04 AM

## 2020-08-08 ENCOUNTER — Ambulatory Visit
Admission: RE | Admit: 2020-08-08 | Discharge: 2020-08-08 | Disposition: A | Discharge status: Home / Self Care | Payer: Medicare Other | Source: Ambulatory Visit | Attending: Family Medicine | Admitting: Family Medicine

## 2020-08-08 ENCOUNTER — Ambulatory Visit: Payer: Medicare Other | Admitting: Family Medicine

## 2020-08-08 ENCOUNTER — Encounter: Payer: Self-pay | Admitting: Family Medicine

## 2020-08-08 ENCOUNTER — Other Ambulatory Visit: Payer: Self-pay

## 2020-08-08 ENCOUNTER — Other Ambulatory Visit: Payer: Self-pay | Admitting: Family Medicine

## 2020-08-08 VITALS — BP 126/80 | Ht 65.0 in | Wt 206.0 lb

## 2020-08-08 DIAGNOSIS — M25552 Pain in left hip: Secondary | ICD-10-CM | POA: Diagnosis not present

## 2020-08-08 DIAGNOSIS — M25561 Pain in right knee: Secondary | ICD-10-CM

## 2020-08-08 NOTE — Patient Instructions (Signed)
Your pain is coming from your hip. Get x-rays after you leave today - I will call you with these results and next steps. These are the typical arthritis instructions: These are the different medications you can take for this: Tylenol 500mg  1-2 tabs three times a day for pain. Capsaicin, aspercreme, or biofreeze topically up to four times a day may also help with pain. Some supplements that may help for arthritis: Boswellia extract, curcumin, pycnogenol Aleve 1-2 tabs twice a day with food Cortisone injections are an option. It's important that you continue to stay active. Straight leg raises, knee extensions 3 sets of 10 once a day (add ankle weight if these become too easy). Consider physical therapy to strengthen muscles around the joint that hurts to take pressure off of the joint itself. Shoe inserts with good arch support may be helpful. Heat or ice 15 minutes at a time 3-4 times a day as needed to help with pain. Water aerobics and cycling with low resistance are the best two types of exercise for arthritis though any exercise is ok as long as it doesn't worsen the pain.

## 2020-08-08 NOTE — Progress Notes (Signed)
PCP: Mick Sell, MD  Subjective:   HPI: Patient is a 60 y.o. female here for left hip pain.  Patient reports she's had left hip pain dating back at least to the late 90s. 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 Noted to have degenerative changes of her low back which has led to 2 back surgeries since then but reports has not had relief with this. Saw Dr. Sherlean Foot previously with imaging in 2003-4 and had hip arthroscopy. Recent visit to neurosurgeon yesterday - advised her pain is likely coming from the hip but also getting MRIs of cervical through lumbar spine. No hip imaging since 2004. She's currently in physical therapy to help with balance. Pain is felt anterior hip, sharp and deep. Worse with lying down. Some radiation down left side. Recalls a recent fall in December which seemed to exacerbate her left hip pain and cause some anterior right knee pain.  Past Medical History:  Diagnosis Date  . Diabetes mellitus    x 12 yrs    Current Outpatient Medications on File Prior to Visit  Medication Sig Dispense Refill  . Accu-Chek FastClix Lancets MISC Use to check blood sugars 3 times daily    . Continuous Blood Gluc Sensor (DEXCOM G6 SENSOR) MISC Inject 1 sensor to the skin every 10 days for continuous glucose monitoring.    . Continuous Blood Gluc Transmit (DEXCOM G6 TRANSMITTER) MISC Use as directed for continuous glucose monitoring. Reuse transmitter for 90 days then discard and replace.    . Dulaglutide (TRULICITY) 4.5 MG/0.5ML SOPN INJECT THE CONTENTS OF ONE  PEN SUBCUTANEOUSLY WEEKLY  AS DIRECTED    . Glucagon 3 MG/DOSE POWD Place into the nose.    . Insulin Disposable Pump (OMNIPOD DASH 5 PACK PODS) MISC Use one pod every 2 days.    . insulin lispro (HUMALOG) 100 UNIT/ML injection FOR USE IN OMNIPOD PUMP  CARTRIDGE; MAX DAILY DOSE  150 UNITS    . Insulin Pen Needle (PEN NEEDLES 3/16") 31G X 5 MM MISC Use to inject  insulin 4 times daily. ICD 10 Code E11.65    . Insulin Syringe-Needle U-100 (EXEL COMFORT POINT INSULIN SYR) 30G X 5/16" 0.3 ML MISC For use with insulin    . albuterol (VENTOLIN HFA) 108 (90 Base) MCG/ACT inhaler Inhale 2 puffs into the lungs every 4 (four) hours as needed.    Marland Kitchen aspirin 81 MG EC tablet Take by mouth.    Marland Kitchen atorvastatin (LIPITOR) 80 MG tablet Take 80 mg by mouth daily.    Marland Kitchen DEXILANT 60 MG capsule Take 1 capsule by mouth daily.    . dorzolamide (TRUSOPT) 2 % ophthalmic solution SMARTSIG:In Eye(s)    . furosemide (LASIX) 40 MG tablet Take 40 mg by mouth daily.      Marland Kitchen gabapentin (NEURONTIN) 100 MG capsule Take 100 mg by mouth at bedtime.     Marland Kitchen latanoprost (XALATAN) 0.005 % ophthalmic solution SMARTSIG:In Eye(s)    . lisinopril (PRINIVIL,ZESTRIL) 10 MG tablet Take 1 tablet by mouth daily.    . metoprolol succinate (TOPROL-XL) 25 MG 24 hr tablet Take 25 mg by mouth daily.    . OxyCODONE (OXYCONTIN) 10 mg T12A 12 hr tablet Take 10 mg by mouth every 4 (four) hours as needed.    . polyethylene glycol (MIRALAX / GLYCOLAX) packet Take 34 g by mouth daily.      . TRULANCE 3 MG TABS     .  venlafaxine (EFFEXOR-XR) 150 MG 24 hr capsule Take 150 mg by mouth daily.      Marland Kitchen VITAMIN E PO Take 1 capsule by mouth daily.       No current facility-administered medications on file prior to visit.    Past Surgical History:  Procedure Laterality Date  . ABDOMINAL HYSTERECTOMY    . ANTERIOR CERVICAL DECOMP/DISCECTOMY FUSION  07/02/2011   Procedure: ANTERIOR CERVICAL DECOMPRESSION/DISCECTOMY FUSION 1 LEVEL;  Surgeon: Cristi Loron, MD;  Location: [facility];  Service: Neurosurgery;  Laterality: Bilateral;  Cervical six-seven anterior cervical decompression  fusion with interbody prosthesis and plating   . BACK SURGERY  2001,2006  . BLADDER SUSPENSION    . CARPAL TUNNEL RELEASE    . CHOLECYSTECTOMY    . HIP ARTHROSCOPY W/ LABRAL DEBRIDEMENT     left  . NISSEN FUNDOPLICATION    . PORT-A-CATH  REMOVAL    . PORTACATH PLACEMENT      Allergies  Allergen Reactions  . Tapentadol Other (See Comments)    Other reaction(s): Other (See Comments) Behavioral Changes. psychosis   . Sulfamethoxazole Other (See Comments)    Yeast infections. Yeast Infections   . Buprenorphine-Naloxone Palpitations  . Topiramate Other (See Comments)    Other reaction(s): Confusion, Mental Status Changes (intolerance) Behavioral Changes. psychosis     Social History   Socioeconomic History  . Marital status: Divorced    Spouse name: Not on file  . Number of children: Not on file  . Years of education: Not on file  . Highest education level: Not on file  Occupational History  . Not on file  Tobacco Use  . Smoking status: Current Every Day Smoker    Packs/day: 0.50    Types: Cigarettes  . Smokeless tobacco: Not on file  Substance and Sexual Activity  . Alcohol use: No  . Drug use: No  . Sexual activity: Yes    Birth control/protection: Post-menopausal  Other Topics Concern  . Not on file  Social History Narrative  . Not on file   Social Determinants of Health   Financial Resource Strain: Not on file  Food Insecurity: Not on file  Transportation Needs: Not on file  Physical Activity: Not on file  Stress: Not on file  Social Connections: Not on file  Intimate Partner Violence: Not on file    Family History  Problem Relation Age of Onset  . Diabetes Mellitus II Mother   . Diabetes Mellitus II Father   . Hypertension Father     BP 126/80   Ht 5\' 5"  (1.651 m)   Wt 206 lb (93.4 kg)   BMI 34.28 kg/m   No flowsheet data found.  No flowsheet data found.  Review of Systems: See HPI above.     Objective:  Physical Exam:  Gen: NAD, comfortable in exam room  Left hip: No deformity. Mild limitation IR and ER with 5/5 strength except 3/5 with hip abduction. Tenderness to palpation anterior hip, less proximal IT band. NVI distally. Positive logroll, fadir.  Negative  faber (anterior pain with this).   Assessment & Plan:  1. Left hip pain - consistent with intraarticular hip pathology.  Will start with radiographs (obtain of right knee as well with fall in December).  Further imaging, possible injection, physical therapy for this, other treatment options will depend on radiographs.  See instructions for further.

## 2020-08-22 ENCOUNTER — Ambulatory Visit: Payer: Medicare Other | Admitting: Family Medicine

## 2020-08-27 ENCOUNTER — Encounter: Payer: Self-pay | Admitting: Family Medicine

## 2020-08-27 ENCOUNTER — Other Ambulatory Visit: Payer: Self-pay

## 2020-08-27 ENCOUNTER — Ambulatory Visit: Payer: Medicare Other | Admitting: Family Medicine

## 2020-08-27 VITALS — BP 132/84 | Ht 65.0 in | Wt 206.0 lb

## 2020-08-27 DIAGNOSIS — M25561 Pain in right knee: Secondary | ICD-10-CM | POA: Diagnosis not present

## 2020-08-27 DIAGNOSIS — M25552 Pain in left hip: Secondary | ICD-10-CM

## 2020-08-27 MED ORDER — METHYLPREDNISOLONE ACETATE 40 MG/ML IJ SUSP
40.0000 mg | Freq: Once | INTRAMUSCULAR | Status: AC
  Administered 2020-08-27: 40 mg via INTRA_ARTICULAR

## 2020-08-27 NOTE — Progress Notes (Signed)
Patient returned today for left hip intraarticular injection and also requested right knee injection both for osteoarthritis.  After informed written consent timeout was performed, patient was lying supine on exam table.  Area overlying left hip prepped with alcohol swab then utilizing ultrasound guidance, patient's left hip joint was injected with 4:1 lidocaine: depomedrol.  Patient tolerated procedure well without immediate complications.  After informed written consent timeout was performed, patient was lying supine on exam table. Right knee was prepped with alcohol swab and utilizing superolateral approach with ultrasound guidance, patient's right knee was injected intraarticularly with 3:1 lidocaine: depomedrol. Patient tolerated the procedure well without immediate complications.

## 2020-09-18 ENCOUNTER — Telehealth: Payer: Self-pay | Admitting: Family Medicine

## 2020-09-18 NOTE — Telephone Encounter (Signed)
-----   Message from [facility], LAT sent at 09/18/2020  9:58 AM EDT ----- Regarding: FW: phone messag  ----- Message ----- From: [name] Sent: 09/18/2020   9:26 AM EDT To: Rutha Bouchard, LAT Subject: phone messag                                   Pt is still having L hip pain, injection didn't help. Started having pain in rt knee. Asking for a call back to discuss if there is anything to do before her appt in a couple weeks. Or discuss recommending a brace at [facility].

## 2020-09-18 NOTE — Telephone Encounter (Signed)
She could use a standard knee brace until she is able to get in to have me examine her.  We could do an MRI of her hip as well if the injection didn't help (she's already seen neurosurgery and been advised not coming from her back).

## 2020-09-19 ENCOUNTER — Other Ambulatory Visit: Payer: Self-pay

## 2020-09-19 DIAGNOSIS — M25552 Pain in left hip: Secondary | ICD-10-CM

## 2020-09-19 NOTE — Progress Notes (Signed)
Pt wanted to move forward with MRI of left hip. Order placed. She will call [facility] to schedule.

## 2020-09-23 ENCOUNTER — Ambulatory Visit
Admission: RE | Admit: 2020-09-23 | Discharge: 2020-09-23 | Disposition: A | Discharge status: Home / Self Care | Payer: Medicare Other | Source: Ambulatory Visit | Attending: Family Medicine | Admitting: Family Medicine

## 2020-09-23 ENCOUNTER — Other Ambulatory Visit: Payer: Self-pay

## 2020-09-23 DIAGNOSIS — M25552 Pain in left hip: Secondary | ICD-10-CM

## 2020-10-08 ENCOUNTER — Other Ambulatory Visit: Payer: Self-pay

## 2020-10-08 ENCOUNTER — Encounter: Payer: Self-pay | Admitting: Family Medicine

## 2020-10-08 ENCOUNTER — Ambulatory Visit: Payer: Medicare Other | Admitting: Family Medicine

## 2020-10-08 VITALS — BP 139/76 | Ht 65.0 in | Wt 200.0 lb

## 2020-10-08 DIAGNOSIS — M25561 Pain in right knee: Secondary | ICD-10-CM

## 2020-10-08 MED ORDER — METHYLPREDNISOLONE ACETATE 40 MG/ML IJ SUSP
40.0000 mg | Freq: Once | INTRAMUSCULAR | Status: AC
  Administered 2020-10-08: 40 mg via INTRA_ARTICULAR

## 2020-10-08 NOTE — Progress Notes (Signed)
PCP: Mick Sell, MD  Subjective:   HPI: Patient is a 60 y.o. female here for right knee pain.  Patient had knee injection at last visit 3/14 but then knee buckled on her since that time and she had worsened pain. Pain is medial, worse with prolonged immobilization like when she drives 30 minutes to take son to school. Injection did help her quite a bit prior to the fall. No catching, locking.  Past Medical History:  Diagnosis Date  . Diabetes mellitus    x 12 yrs    Current Outpatient Medications on File Prior to Visit  Medication Sig Dispense Refill  . Accu-Chek FastClix Lancets MISC Use to check blood sugars 3 times daily    . albuterol (VENTOLIN HFA) 108 (90 Base) MCG/ACT inhaler Inhale 2 puffs into the lungs every 4 (four) hours as needed.    Marland Kitchen aspirin 81 MG EC tablet Take by mouth.    Marland Kitchen atorvastatin (LIPITOR) 80 MG tablet Take 80 mg by mouth daily.    . Continuous Blood Gluc Sensor (DEXCOM G6 SENSOR) MISC Inject 1 sensor to the skin every 10 days for continuous glucose monitoring.    . Continuous Blood Gluc Transmit (DEXCOM G6 TRANSMITTER) MISC Use as directed for continuous glucose monitoring. Reuse transmitter for 90 days then discard and replace.    Marland Kitchen DEXILANT 60 MG capsule Take 1 capsule by mouth daily.    . dorzolamide (TRUSOPT) 2 % ophthalmic solution SMARTSIG:In Eye(s)    . Dulaglutide (TRULICITY) 4.5 MG/0.5ML SOPN INJECT THE CONTENTS OF ONE  PEN SUBCUTANEOUSLY WEEKLY  AS DIRECTED    . furosemide (LASIX) 40 MG tablet Take 40 mg by mouth daily.      Marland Kitchen gabapentin (NEURONTIN) 100 MG capsule Take 100 mg by mouth at bedtime.     . Glucagon 3 MG/DOSE POWD Place into the nose.    . Insulin Disposable Pump (OMNIPOD DASH 5 PACK PODS) MISC Use one pod every 2 days.    . insulin lispro (HUMALOG) 100 UNIT/ML injection FOR USE IN OMNIPOD PUMP  CARTRIDGE; MAX DAILY DOSE  150 UNITS    . Insulin Pen Needle (PEN NEEDLES 3/16") 31G X 5 MM MISC Use to inject insulin 4 times daily.  ICD 10 Code E11.65    . Insulin Syringe-Needle U-100 (EXEL COMFORT POINT INSULIN SYR) 30G X 5/16" 0.3 ML MISC For use with insulin    . latanoprost (XALATAN) 0.005 % ophthalmic solution SMARTSIG:In Eye(s)    . lisinopril (PRINIVIL,ZESTRIL) 10 MG tablet Take 1 tablet by mouth daily.    . metoprolol succinate (TOPROL-XL) 25 MG 24 hr tablet Take 25 mg by mouth daily.    . OxyCODONE (OXYCONTIN) 10 mg T12A 12 hr tablet Take 10 mg by mouth every 4 (four) hours as needed.    . polyethylene glycol (MIRALAX / GLYCOLAX) packet Take 34 g by mouth daily.      . TRULANCE 3 MG TABS     . venlafaxine (EFFEXOR-XR) 150 MG 24 hr capsule Take 150 mg by mouth daily.      Marland Kitchen VITAMIN E PO Take 1 capsule by mouth daily.       No current facility-administered medications on file prior to visit.    Past Surgical History:  Procedure Laterality Date  . ABDOMINAL HYSTERECTOMY    . ANTERIOR CERVICAL DECOMP/DISCECTOMY FUSION  07/02/2011   Procedure: ANTERIOR CERVICAL DECOMPRESSION/DISCECTOMY FUSION 1 LEVEL;  Surgeon: Cristi Loron, MD;  Location: [facility];  Service: Neurosurgery;  Laterality: Bilateral;  Cervical six-seven anterior cervical decompression  fusion with interbody prosthesis and plating   . BACK SURGERY  2001,2006  . BLADDER SUSPENSION    . CARPAL TUNNEL RELEASE    . CHOLECYSTECTOMY    . HIP ARTHROSCOPY W/ LABRAL DEBRIDEMENT     left  . NISSEN FUNDOPLICATION    . PORT-A-CATH REMOVAL    . PORTACATH PLACEMENT      Allergies  Allergen Reactions  . Tapentadol Other (See Comments)    Other reaction(s): Other (See Comments) Behavioral Changes. psychosis   . Sulfamethoxazole Other (See Comments)    Yeast infections. Yeast Infections   . Buprenorphine-Naloxone Palpitations  . Topiramate Other (See Comments)    Other reaction(s): Confusion, Mental Status Changes (intolerance) Behavioral Changes. psychosis     Social History   Socioeconomic History  . Marital status: Divorced     Spouse name: Not on file  . Number of children: Not on file  . Years of education: Not on file  . Highest education level: Not on file  Occupational History  . Not on file  Tobacco Use  . Smoking status: Current Every Day Smoker    Packs/day: 0.50    Types: Cigarettes  . Smokeless tobacco: Not on file  Substance and Sexual Activity  . Alcohol use: No  . Drug use: No  . Sexual activity: Yes    Birth control/protection: Post-menopausal  Other Topics Concern  . Not on file  Social History Narrative  . Not on file   Social Determinants of Health   Financial Resource Strain: Not on file  Food Insecurity: Not on file  Transportation Needs: Not on file  Physical Activity: Not on file  Stress: Not on file  Social Connections: Not on file  Intimate Partner Violence: Not on file    Family History  Problem Relation Age of Onset  . Diabetes Mellitus II Mother   . Diabetes Mellitus II Father   . Hypertension Father     BP 139/76   Ht 5\' 5"  (1.651 m)   Wt 200 lb (90.7 kg)   BMI 33.28 kg/m   No flowsheet data found.  No flowsheet data found.  Review of Systems: See HPI above.     Objective:  Physical Exam:  Gen: NAD, comfortable in exam room  Right knee: No gross deformity, ecchymoses, swelling. TTP medial joint line and just inferior to this. FROM with normal strength. Negative ant/post drawers. Negative valgus/varus testing. Negative lachman. Medial pain with mcmurrays, apleys. NV intact distally.   Assessment & Plan:  1. Right knee pain - repeated injection today.  Concern for flap type meniscus tear vs bucket handle tear in addition to her mild underlying arthritis leading to her knee buckling at times.  Given lack of improvement since last visit will go ahead with MRI as well.  Consider ortho referral.  After informed written consent timeout was performed, patient was seated in chair in exam room. Right knee was prepped with alcohol swab and utilizing  anteromedial approach, patient's right knee was injected intraarticularly with 3:1 lidocaine: depomedrol. Patient tolerated the procedure well without immediate complications.

## 2020-10-08 NOTE — Patient Instructions (Signed)
We repeated your knee injection today. We will go ahead with an MRI to assess for a flap type meniscus tear as well in this right knee. Ask your pain medicine doctor about increasing your gabapentin - if you tolerate this, I think it would help you quite a bit in addition to your oxycodone. I will call you with the MRI results to discuss next steps.

## 2020-10-21 ENCOUNTER — Ambulatory Visit
Admission: RE | Admit: 2020-10-21 | Discharge: 2020-10-21 | Disposition: A | Discharge status: Home / Self Care | Payer: Medicare Other | Source: Ambulatory Visit | Attending: Family Medicine | Admitting: Family Medicine

## 2020-10-21 ENCOUNTER — Other Ambulatory Visit: Payer: Self-pay

## 2020-10-21 DIAGNOSIS — M25561 Pain in right knee: Secondary | ICD-10-CM

## 2021-05-20 IMAGING — MR MR KNEE*R* W/O CM
4 of 5 series · 21 of 40 positions shown · non-contrast
Comparison: X-ray 08/08/2020, MRI 07/31/2009

CLINICAL DATA: Medial right knee pain.  History of falls

EXAM:
MRI OF THE RIGHT KNEE WITHOUT CONTRAST
TECHNIQUE: Multiplanar, multisequence MR imaging of the knee was performed. No
intravenous contrast was administered.

[Series 3: [id number]_tra · axial · 4.0mm · 0.66mm/px · z∈[-78,+37]mm · 3 of 33 slices shown]
[im 5/33]
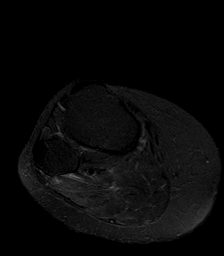
[im 17/33]
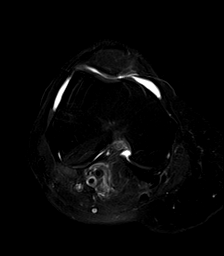
[im 29/33]
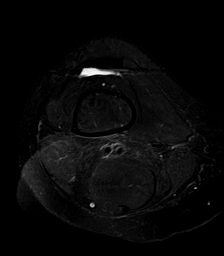

[Series 4: T1 · coronal · 4.0mm · 0.29mm/px · 3 of 29 slices shown]
[im 5/29]
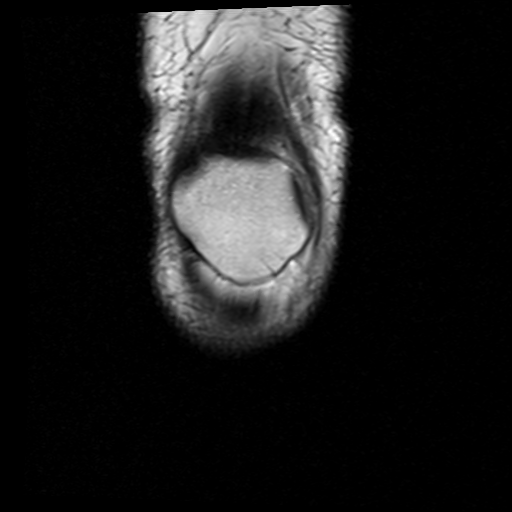
[im 15/29]
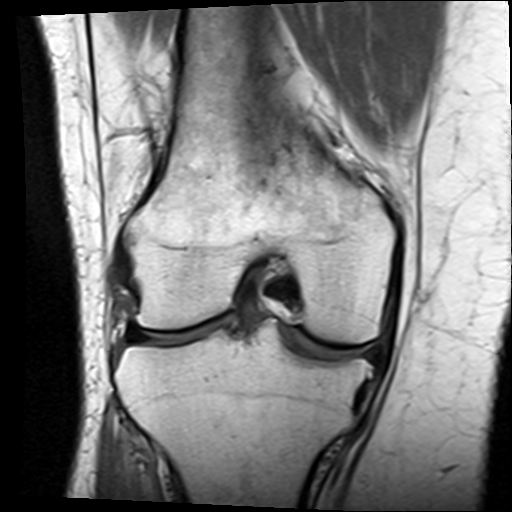
[im 24/29]
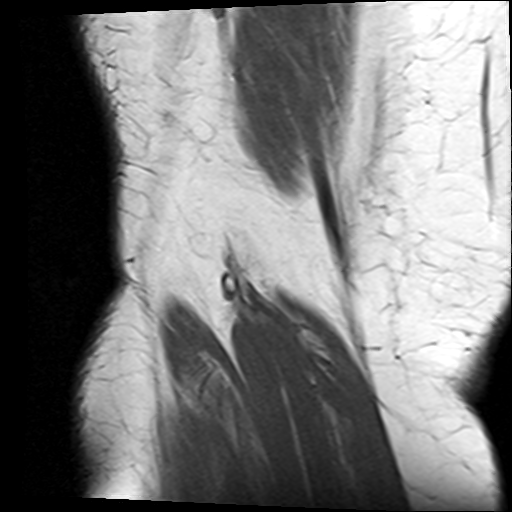

[Series 8: PD fat-sat · sagittal · 3.0mm · 0.31mm/px · 8 of 33 slices shown]
[im 1/33]
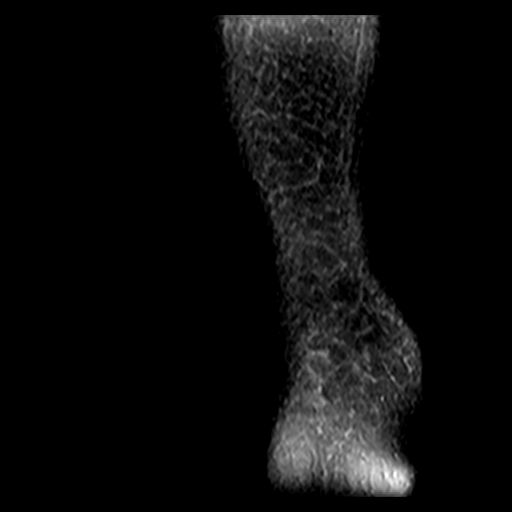
[im 5/33]
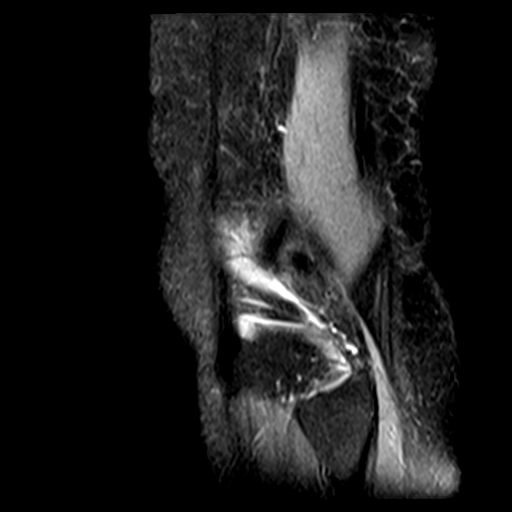
[im 10/33]
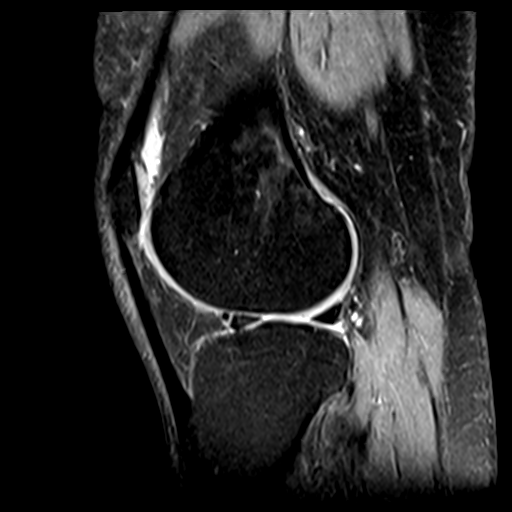
[im 14/33]
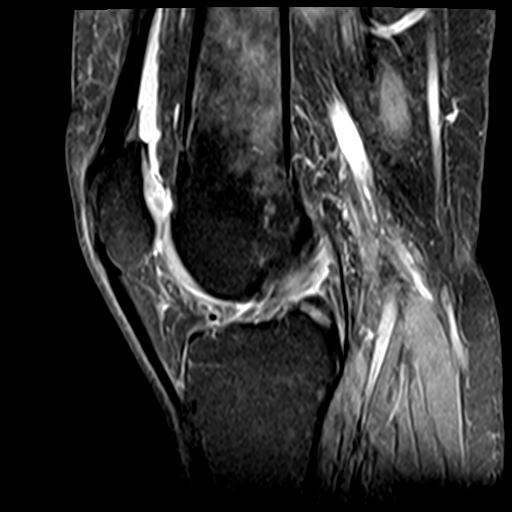
[im 19/33]
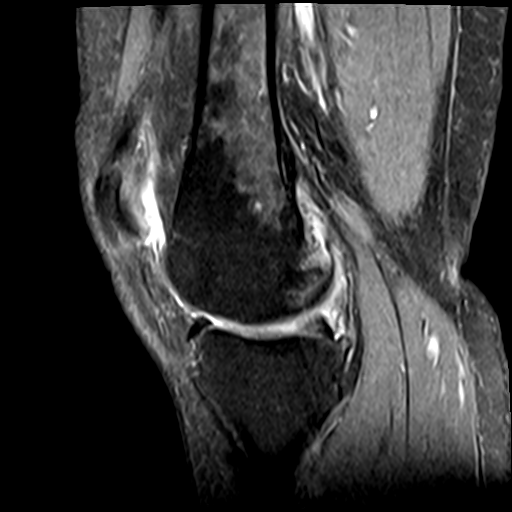
[im 23/33]
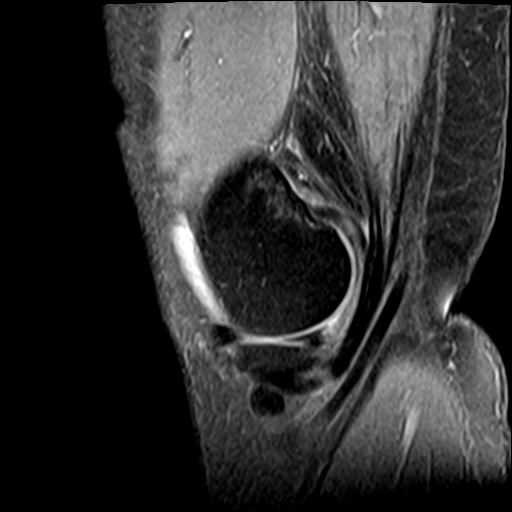
[im 28/33]
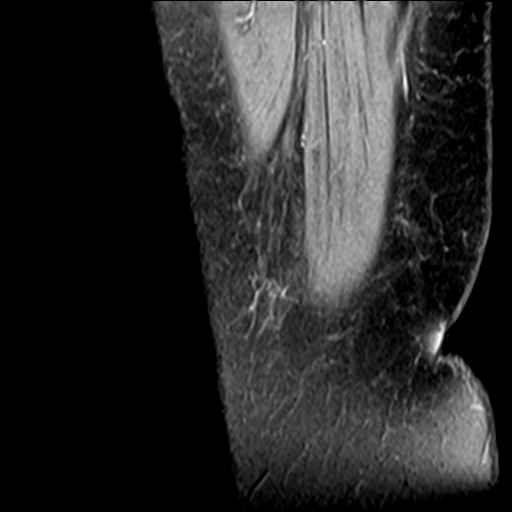
[im 33/33]
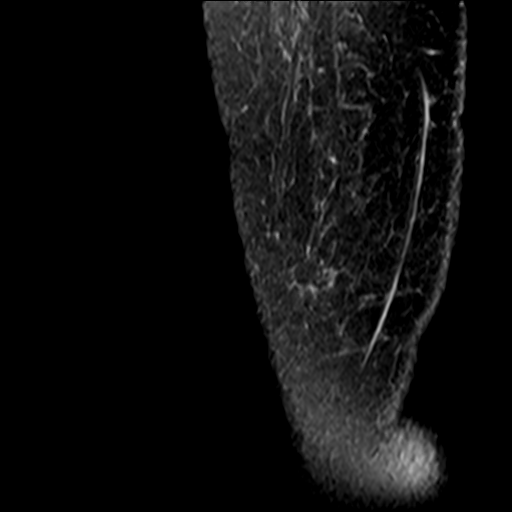

[Series 9: T2 fat-sat · sagittal · 3.0mm · 0.31mm/px · 7 of 33 slices shown]
[im 1/33]
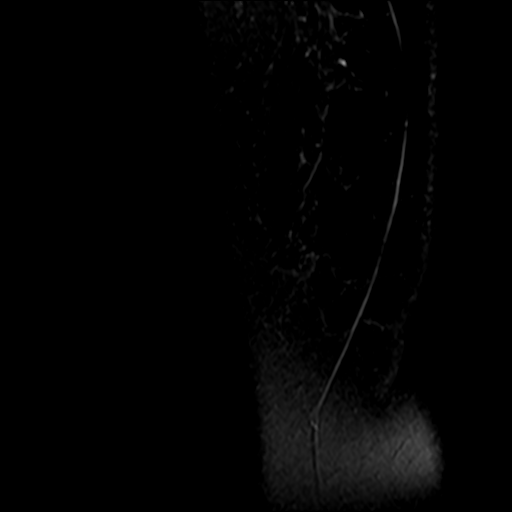
[im 5/33]
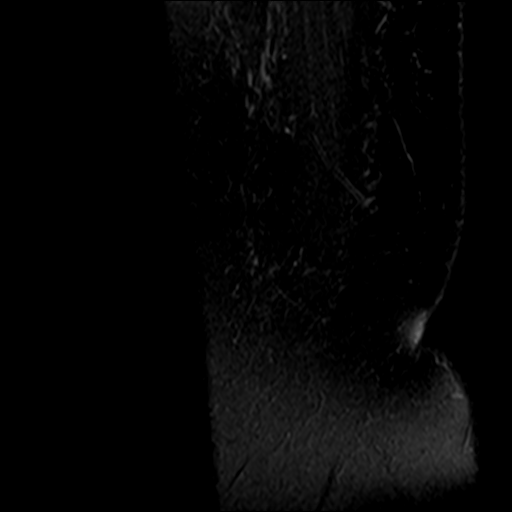
[im 10/33]
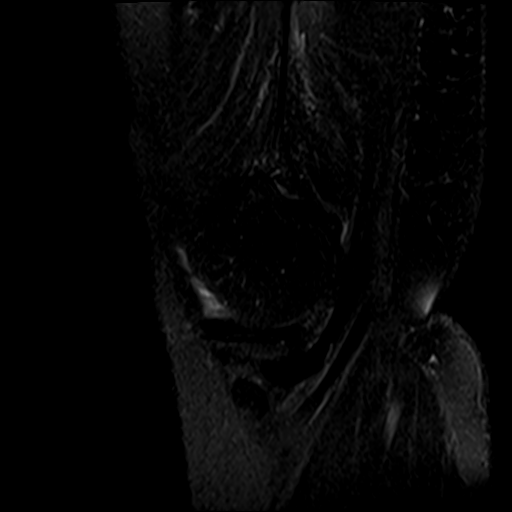
[im 14/33]
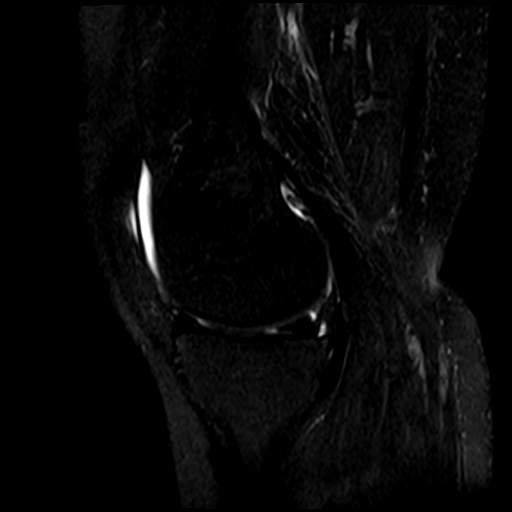
[im 19/33]
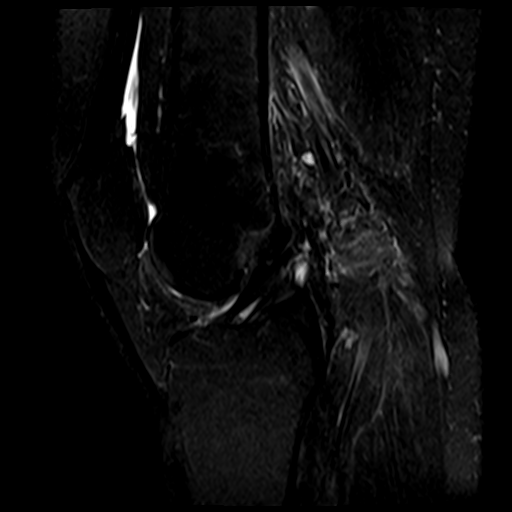
[im 23/33]
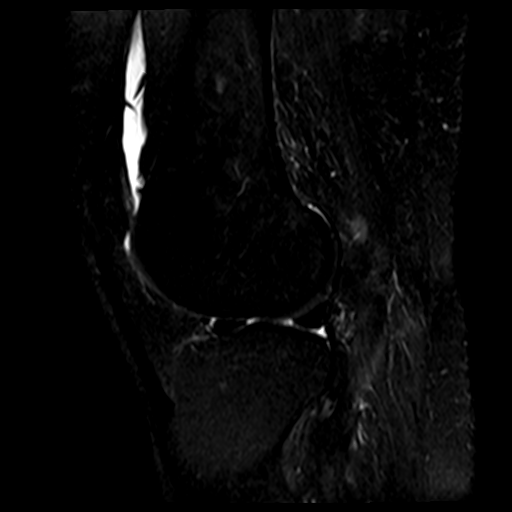
[im 28/33]
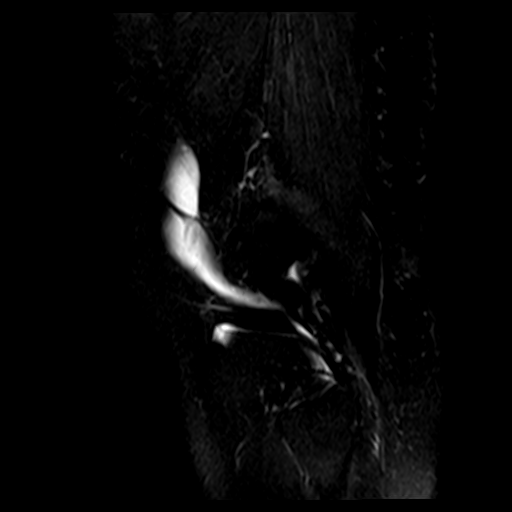

[21 of 40 positions shown; findings below may reference images not displayed]

FINDINGS: MENISCI

Medial meniscus: Intrasubstance degeneration with free edge fraying
of the posterior horn and body segments.

Lateral meniscus:  Intact.

LIGAMENTS

Cruciates:  Intact ACL and PCL.

Collaterals: Medial collateral ligament is intact. Lateral
collateral ligament complex is intact.

CARTILAGE

Patellofemoral: Motion artifact degrades evaluation of the
patellofemoral cartilage on axial sequence. Suspect partial
thickness chondral fissuring of the lateral patellar facet with
possible delamination component. Mild chondral surface irregularity
within the trochlear groove.

Medial: Mild chondral thinning of the weight-bearing medial
compartment without focal chondral defect.

Lateral:  No chondral defect.

Joint:  Small knee joint effusion.  Fat pads within normal limits.

Popliteal Fossa:  No Baker cyst. Intact popliteus tendon.

Extensor Mechanism:  Intact quadriceps tendon and patellar tendon.

Bones: Mild medial compartment joint space narrowing with small
marginal osteophytes. Tiny curvilinear osseous density emanates from
the medial tibial metaphysis at the distal MCL attachment site,
likely reflecting sequela of remote trauma. No acute fracture or
malalignment. No suspicious bone lesion.

Other: None.
IMPRESSION: 1. Mild medial and patellofemoral compartment osteoarthritis.
2. Intrasubstance degeneration with free edge fraying of the
posterior horn and body segments of the medial meniscus.
3. Small knee joint effusion.

## 2023-12-25 ENCOUNTER — Ambulatory Visit: Payer: Self-pay

## 2023-12-25 NOTE — Telephone Encounter (Signed)
 FYI Only or Action Required?: FYI only for provider.  Patient was last seen in primary care on .  Called Nurse Triage reporting Advice Only.  Symptoms began  .  Interventions attempted: Nothing.  Symptoms are:  .  Triage Disposition: Information or Advice Only Call  Patient/caregiver understands and will follow disposition?: Yes  Advised Debbie, pt's sister to call [facility] to verify and see if pt covered for any rehab for leg amputation and also call [facility] hospital and speak with MD or SW covering pt's care. Debbie verbalized understanding.   Copied from CRM [id number]. Topic: Clinical - Red Word Triage >> Dec 25, 2023  8:59 AM Nathanel DEL wrote: Red Word that prompted transfer to Nurse Triage: Marval states her sister is in [facility] hospital.  Sh had leg amputated.  They are going to discharge her todsy w/ no rehab.  She doesn't not know where to take her. Marval [phone] Reason for Disposition  Health information question, no triage required and triager able to answer question  Answer Assessment - Initial Assessment Questions 1. REASON FOR CALL: What is the main reason for your call? or How can I best help you?     Pt in [facility] hospital, had leg amputated and pt needing to go to rehab rather than being dc home. Debbie pt's sister was told she had today until 2 to figure out plan  Protocols used: Information Only Call - No Triage-A-AH
